# Patient Record
Sex: Female | Born: 1950
Health system: Southern US, Community
[De-identification: ages and names within clinical notes are randomized; demographics above are authoritative.]

## PROBLEM LIST (undated history)

## (undated) DIAGNOSIS — G4700 Insomnia, unspecified: Secondary | ICD-10-CM

## (undated) DIAGNOSIS — M797 Fibromyalgia: Secondary | ICD-10-CM

## (undated) DIAGNOSIS — J45909 Unspecified asthma, uncomplicated: Secondary | ICD-10-CM

## (undated) DIAGNOSIS — R7611 Nonspecific reaction to tuberculin skin test without active tuberculosis: Secondary | ICD-10-CM

## (undated) DIAGNOSIS — M1990 Unspecified osteoarthritis, unspecified site: Secondary | ICD-10-CM

## (undated) HISTORY — DX: Unspecified asthma, uncomplicated: J45.909

## (undated) HISTORY — DX: Nonspecific reaction to tuberculin skin test without active tuberculosis: R76.11

## (undated) HISTORY — PX: TONSILLECTOMY: SUR1361

## (undated) HISTORY — PX: OTHER SURGICAL HISTORY: SHX169

## (undated) HISTORY — DX: Unspecified osteoarthritis, unspecified site: M19.90

## (undated) HISTORY — DX: Fibromyalgia: M79.7

## (undated) HISTORY — DX: Insomnia, unspecified: G47.00

---

## 1988-04-24 HISTORY — PX: TOTAL ABDOMINAL HYSTERECTOMY: SHX209

## 1998-02-18 ENCOUNTER — Other Ambulatory Visit: Admission: RE | Admit: 1998-02-18 | Discharge: 1998-02-18 | Payer: Self-pay | Admitting: Obstetrics and Gynecology

## 1999-03-08 ENCOUNTER — Other Ambulatory Visit: Admission: RE | Admit: 1999-03-08 | Discharge: 1999-03-08 | Payer: Self-pay | Admitting: Obstetrics and Gynecology

## 2000-04-03 ENCOUNTER — Other Ambulatory Visit: Admission: RE | Admit: 2000-04-03 | Discharge: 2000-04-03 | Payer: Self-pay | Admitting: Obstetrics and Gynecology

## 2001-12-04 ENCOUNTER — Encounter: Payer: Self-pay | Admitting: Family Medicine

## 2001-12-04 ENCOUNTER — Encounter: Admission: RE | Admit: 2001-12-04 | Discharge: 2001-12-04 | Payer: Self-pay | Admitting: Family Medicine

## 2003-01-21 ENCOUNTER — Other Ambulatory Visit: Admission: RE | Admit: 2003-01-21 | Discharge: 2003-01-21 | Payer: Self-pay | Admitting: Obstetrics and Gynecology

## 2004-03-15 ENCOUNTER — Other Ambulatory Visit: Admission: RE | Admit: 2004-03-15 | Discharge: 2004-03-15 | Payer: Self-pay | Admitting: Obstetrics and Gynecology

## 2005-05-29 ENCOUNTER — Encounter: Admission: RE | Admit: 2005-05-29 | Discharge: 2005-05-29 | Payer: Self-pay | Admitting: Family Medicine

## 2005-06-06 ENCOUNTER — Other Ambulatory Visit: Admission: RE | Admit: 2005-06-06 | Discharge: 2005-06-06 | Payer: Self-pay | Admitting: Obstetrics and Gynecology

## 2006-09-28 ENCOUNTER — Ambulatory Visit: Payer: Self-pay | Admitting: Internal Medicine

## 2006-12-14 ENCOUNTER — Ambulatory Visit: Payer: Self-pay | Admitting: Internal Medicine

## 2006-12-14 ENCOUNTER — Encounter: Payer: Self-pay | Admitting: Internal Medicine

## 2010-02-15 ENCOUNTER — Emergency Department (HOSPITAL_COMMUNITY): Admission: EM | Admit: 2010-02-15 | Discharge: 2010-02-15 | Payer: Self-pay | Admitting: Emergency Medicine

## 2010-05-15 ENCOUNTER — Encounter: Payer: Self-pay | Admitting: Rheumatology

## 2010-07-06 LAB — URINALYSIS, ROUTINE W REFLEX MICROSCOPIC
Bilirubin Urine: NEGATIVE
Glucose, UA: NEGATIVE mg/dL
Hgb urine dipstick: NEGATIVE
Ketones, ur: NEGATIVE mg/dL
Nitrite: NEGATIVE
Protein, ur: NEGATIVE mg/dL
Specific Gravity, Urine: 1.006 (ref 1.005–1.030)
Urobilinogen, UA: 0.2 mg/dL (ref 0.0–1.0)
pH: 7 (ref 5.0–8.0)

## 2010-07-06 LAB — BASIC METABOLIC PANEL
BUN: 10 mg/dL (ref 6–23)
CO2: 27 mEq/L (ref 19–32)
Chloride: 108 mEq/L (ref 96–112)
Glucose, Bld: 87 mg/dL (ref 70–99)
Potassium: 4.1 mEq/L (ref 3.5–5.1)

## 2013-09-01 ENCOUNTER — Ambulatory Visit
Admission: RE | Admit: 2013-09-01 | Discharge: 2013-09-01 | Disposition: A | Discharge status: Home / Self Care | Payer: BC Managed Care – PPO | Source: Ambulatory Visit | Attending: Family Medicine | Admitting: Family Medicine

## 2013-09-01 ENCOUNTER — Other Ambulatory Visit: Payer: Self-pay | Admitting: Family Medicine

## 2013-09-01 DIAGNOSIS — R509 Fever, unspecified: Secondary | ICD-10-CM

## 2013-09-01 DIAGNOSIS — R05 Cough: Secondary | ICD-10-CM

## 2013-09-01 DIAGNOSIS — R059 Cough, unspecified: Secondary | ICD-10-CM

## 2013-09-22 ENCOUNTER — Encounter: Payer: Self-pay | Admitting: Internal Medicine

## 2014-07-08 ENCOUNTER — Other Ambulatory Visit: Payer: Self-pay | Admitting: Family Medicine

## 2014-07-08 ENCOUNTER — Ambulatory Visit
Admission: RE | Admit: 2014-07-08 | Discharge: 2014-07-08 | Disposition: A | Discharge status: Home / Self Care | Payer: 59 | Source: Ambulatory Visit | Attending: Family Medicine | Admitting: Family Medicine

## 2014-07-08 DIAGNOSIS — R05 Cough: Secondary | ICD-10-CM

## 2014-07-08 DIAGNOSIS — R062 Wheezing: Secondary | ICD-10-CM

## 2014-07-08 DIAGNOSIS — R059 Cough, unspecified: Secondary | ICD-10-CM

## 2014-07-08 DIAGNOSIS — Z2089 Contact with and (suspected) exposure to other communicable diseases: Secondary | ICD-10-CM

## 2014-11-24 ENCOUNTER — Other Ambulatory Visit: Payer: Self-pay | Admitting: Obstetrics and Gynecology

## 2014-11-25 LAB — CYTOLOGY - PAP

## 2015-06-14 ENCOUNTER — Encounter: Payer: Self-pay | Admitting: Internal Medicine

## 2015-06-16 ENCOUNTER — Encounter: Payer: Self-pay | Admitting: Internal Medicine

## 2015-06-16 ENCOUNTER — Ambulatory Visit (INDEPENDENT_AMBULATORY_CARE_PROVIDER_SITE_OTHER): Payer: 59 | Admitting: Internal Medicine

## 2015-06-16 VITALS — BP 132/68 | HR 88 | Ht 59.0 in | Wt 100.8 lb

## 2015-06-16 DIAGNOSIS — R0989 Other specified symptoms and signs involving the circulatory and respiratory systems: Secondary | ICD-10-CM

## 2015-06-16 DIAGNOSIS — R062 Wheezing: Secondary | ICD-10-CM

## 2015-06-16 LAB — NITRIC OXIDE: NITRIC OXIDE: 15

## 2015-06-16 NOTE — Progress Notes (Signed)
Subjective:    Patient ID: Victoria Watson, female    DOB: 1950/12/20, 65 y.o.   MRN: [id number] PCP Astrid Divine, MD  HPI  IOV 06/16/2015  Chief Complaint  Patient presents with  . Pulmonary Consult    Pt referred by Dr. Corliss Skains for wheezing in all lung fields x 2 years. Pt denies cough, CP/tightness, and SOB.     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 Follows with Dr. Algis Downs for fibromyalgia and myofascial spasm since several years. For the last 2 years she reports that both Dr. Algis Downs and her primary care physician Dr. Valentina Lucks have noticed squeaks and wheezing on her physical exam he did cause of which remains unclear. There for she's been referred. Patient herself is asymptomatic except when she exerts really hard when she runs of her grandkids. She says that this abnormal physical exam started in 2015. In May 2015 she had a horrible episode of bronchitis which lasted 3 months she was treated with Z-Pak. Then in August 2015 during a wellness visit was the first and they heard squeaks in her exam. This was followed again in October 2015 with another episode of bronchitis which was treated with Septra. Apparently a chest x-ray at that time did not show pneumonia. Subsequent exam in December 2015 also heard squeaks. Then in March 2016 a diagnosis of asthmatic bronchitis was made for the same squeaks. A repeat chest x-ray apparently was normal. She was given Ventolin but this does not help. Subsequent physical exams always reveals squeaks both in June 2016, August 2016 and December 2006 seen. There for she's been referred here.  In 2009 she was TB skin test positive and took INH prophylaxis for latent TB.  Chest x-ray March 2016 personally visualized and is clear other than hyperaeration.  Exhaled nitric oxide for  as eosinophilic asthma is normal at 15 ppb.    has a past medical history of Fibromyalgia; Insomnia; OA (osteoarthritis); and Positive PPD.   reports that  she has never smoked. She has never used smokeless tobacco.  Past Surgical History  Procedure Laterality Date  . Total abdominal hysterectomy  1990  . Tonsillectomy      Not on File  Immunization History  Administered Date(s) Administered  . Influenza,inj,Quad PF,36+ Mos 12/22/2014  . Tdap 09/03/2009    Family History  Problem Relation Age of Onset  . Heart disease Father   . Heart disease Paternal Grandmother   . Heart disease Paternal Grandfather   . COPD Father      Current outpatient prescriptions:  .  Biotin 5 MG TABS, Take by mouth daily., Disp: , Rfl:  .  Calcium Carbonate-Vitamin D (CALCIUM-VITAMIN D) 500-200 MG-UNIT tablet, Take 2 tablets by mouth daily., Disp: , Rfl:  .  cyclobenzaprine (FLEXERIL) 10 MG tablet, Take 10 mg by mouth 3 (three) times daily as needed for muscle spasms., Disp: , Rfl:  .  cycloSPORINE (RESTASIS) 0.05 % ophthalmic emulsion, Place 1 drop into both eyes 2 (two) times daily., Disp: , Rfl:  .  estradiol (ESTRACE) 1 MG tablet, Take 1 mg by mouth daily., Disp: , Rfl:  .  Eszopiclone (ESZOPICLONE) 3 MG TABS, Take 3 mg by mouth at bedtime. Take immediately before bedtime, Disp: , Rfl:  .  fluticasone (FLONASE) 50 MCG/ACT nasal spray, Place into both nostrils as needed for allergies or rhinitis., Disp: , Rfl:  .  Magnesium 250 MG TABS, Take by mouth daily., Disp: , Rfl:  .  meloxicam (MOBIC)  7.5 MG tablet, Take 3.75 mg by mouth daily., Disp: , Rfl:  .  Multiple Vitamins-Minerals (MULTIVITAMIN WITH MINERALS) tablet, Take 1 tablet by mouth daily., Disp: , Rfl:  .  traMADol (ULTRAM) 50 MG tablet, Take 50 mg by mouth every 6 (six) hours as needed., Disp: , Rfl:       Review of Systems  Constitutional: Negative for fever and unexpected weight change.  HENT: Negative for congestion, dental problem, ear pain, nosebleeds, postnasal drip, rhinorrhea, sinus pressure, sneezing, sore throat and trouble swallowing.   Eyes: Negative for redness and itching.    Respiratory: Negative for cough, chest tightness, shortness of breath and wheezing.   Cardiovascular: Negative for palpitations and leg swelling.  Gastrointestinal: Negative for nausea and vomiting.  Genitourinary: Negative for dysuria.  Musculoskeletal: Negative for joint swelling.  Skin: Negative for rash.  Neurological: Negative for headaches.  Hematological: Does not bruise/bleed easily.  Psychiatric/Behavioral: Negative for dysphoric mood. The patient is not nervous/anxious.        Objective:   Physical Exam  Constitutional: She is oriented to person, place, and time. She appears well-developed and well-nourished. No distress.  HENT:  Head: Normocephalic and atraumatic.  Right Ear: External ear normal.  Left Ear: External ear normal.  Mouth/Throat: Oropharynx is clear and moist. No oropharyngeal exudate.  Eyes: Conjunctivae and EOM are normal. Pupils are equal, round, and reactive to light. Right eye exhibits no discharge. Left eye exhibits no discharge. No scleral icterus.  Neck: Normal range of motion. Neck supple. No JVD present. No tracheal deviation present. No thyromegaly present.  Cardiovascular: Normal rate, regular rhythm, normal heart sounds and intact distal pulses.  Exam reveals no gallop and no friction rub.   No murmur heard. Pulmonary/Chest: Effort normal and breath sounds normal. No respiratory distress. She has no wheezes. She has no rales. She exhibits no tenderness.  End inspiratory squeaky wheeze in the bilateral upper lobes. In the lower lobes are possible crackles  Abdominal: Soft. Bowel sounds are normal. She exhibits no distension and no mass. There is no tenderness. There is no rebound and no guarding.  Musculoskeletal: Normal range of motion. She exhibits no edema or tenderness.  Lymphadenopathy:    She has no cervical adenopathy.  Neurological: She is alert and oriented to person, place, and time. She has normal reflexes. No cranial nerve deficit. She  exhibits normal muscle tone. Coordination normal.  Skin: Skin is warm and dry. No rash noted. She is not diaphoretic. No erythema. No pallor.  Psychiatric: She has a normal mood and affect. Her behavior is normal. Judgment and thought content normal.  Vitals reviewed.   Filed Vitals:   06/16/15 1534  BP: 132/68  Pulse: 88  Height: 4\' 11"  (1.499 m)  Weight: 100 lb 12.8 oz (45.723 kg)  SpO2: 98%    Estimated body mass index is 20.35 kg/(m^2) as calculated from the following:   Height as of this encounter: 4\' 11"  (1.499 m).   Weight as of this encounter: 100 lb 12.8 oz (45.723 kg).      Assessment & Plan:     ICD-9-CM ICD-10-CM   1. Wheezing 786.07 R06.2   2. Chest crackles 786.7 R09.89    Unclear what is going on. It is possible that she has some residual bronchiectasis after her severe episode of bronchitis in May 2015. She does not have eosinophilic airway inflammation as evidenced by normal exhaled nitric oxide. At this point it is best to do several resting noninvasive pulmonary  tests   High Resolution CT chest without contrast on ILD protocol Do full PFT Do Methacholine challenge test  followup  - return to see me or my NP < 3 weeks from now but after completing all of above   Dr. Kalman Shan, M.D., [facility].C.P Pulmonary and Critical Care Medicine Staff Physician [facility] [place] [facility] Pager: [phone], If no answer or between  15:00h - 7:00h: call [phone]  06/16/2015 4:06 PM

## 2015-06-16 NOTE — Addendum Note (Signed)
Addended by: Sheran Luz on: 06/16/2015 05:41 PM   Modules accepted: Orders

## 2015-06-16 NOTE — Patient Instructions (Signed)
ICD-9-CM ICD-10-CM   1. Wheezing 786.07 R06.2   2. Chest crackles 786.7 R09.89     High Resolution CT chest without contrast on ILD protocol Do full PFT Do Methacholine challenge test  followup  - return to see me or my NP < 3 weeks from now but after completing all of above

## 2015-06-28 ENCOUNTER — Ambulatory Visit (HOSPITAL_COMMUNITY)
Admission: RE | Admit: 2015-06-28 | Discharge: 2015-06-28 | Disposition: A | Discharge status: Home / Self Care | Payer: 59 | Source: Ambulatory Visit | Attending: Internal Medicine | Admitting: Internal Medicine

## 2015-06-28 DIAGNOSIS — R062 Wheezing: Secondary | ICD-10-CM | POA: Diagnosis present

## 2015-06-28 DIAGNOSIS — R0989 Other specified symptoms and signs involving the circulatory and respiratory systems: Secondary | ICD-10-CM | POA: Diagnosis not present

## 2015-06-28 LAB — PULMONARY FUNCTION TEST
FEF 25-75 POST: 1.87 L/s
FEF 25-75 Pre: 1.94 L/sec
FEF2575-%CHANGE-POST: -3 %
FEF2575-%PRED-POST: 96 %
FEF2575-%PRED-PRE: 100 %
FEV1-%CHANGE-POST: 0 %
FEV1-%Pred-Post: 91 %
FEV1-%Pred-Pre: 92 %
FEV1-Post: 1.83 L
FEV1-Pre: 1.83 L
FEV1FVC-%CHANGE-POST: -1 %
FEV1FVC-%PRED-PRE: 104 %
FEV6-%Change-Post: 1 %
FEV6-%Pred-Post: 90 %
FEV6-%Pred-Pre: 89 %
FEV6-POST: 2.26 L
FEV6-Pre: 2.23 L
FEV6FVC-%Change-Post: 0 %
FEV6FVC-%PRED-POST: 104 %
FEV6FVC-%Pred-Pre: 103 %
FVC-%Change-Post: 1 %
FVC-%PRED-POST: 87 %
FVC-%PRED-PRE: 86 %
FVC-POST: 2.27 L
FVC-PRE: 2.24 L
POST FEV1/FVC RATIO: 80 %
PRE FEV1/FVC RATIO: 82 %
PRE FEV6/FVC RATIO: 100 %
Post FEV6/FVC ratio: 100 %

## 2015-06-28 MED ORDER — METHACHOLINE 16 MG/ML NEB SOLN
2.0000 mL | Freq: Once | RESPIRATORY_TRACT | Status: AC
  Administered 2015-06-28: 32 mg via RESPIRATORY_TRACT

## 2015-06-28 MED ORDER — SODIUM CHLORIDE 0.9 % IN NEBU
3.0000 mL | INHALATION_SOLUTION | Freq: Once | RESPIRATORY_TRACT | Status: AC
  Administered 2015-06-28: 3 mL via RESPIRATORY_TRACT

## 2015-06-28 MED ORDER — METHACHOLINE 1 MG/ML NEB SOLN
2.0000 mL | Freq: Once | RESPIRATORY_TRACT | Status: AC
  Administered 2015-06-28: 2 mg via RESPIRATORY_TRACT

## 2015-06-28 MED ORDER — METHACHOLINE 0.0625 MG/ML NEB SOLN
2.0000 mL | Freq: Once | RESPIRATORY_TRACT | Status: AC
  Administered 2015-06-28: 0.125 mg via RESPIRATORY_TRACT

## 2015-06-28 MED ORDER — ALBUTEROL SULFATE (2.5 MG/3ML) 0.083% IN NEBU
2.5000 mg | INHALATION_SOLUTION | Freq: Once | RESPIRATORY_TRACT | Status: AC
  Administered 2015-06-28: 2.5 mg via RESPIRATORY_TRACT

## 2015-06-28 MED ORDER — METHACHOLINE 0.25 MG/ML NEB SOLN
2.0000 mL | Freq: Once | RESPIRATORY_TRACT | Status: AC
  Administered 2015-06-28: 0.5 mg via RESPIRATORY_TRACT

## 2015-06-28 MED ORDER — METHACHOLINE 4 MG/ML NEB SOLN
2.0000 mL | Freq: Once | RESPIRATORY_TRACT | Status: AC
  Administered 2015-06-28: 8 mg via RESPIRATORY_TRACT

## 2015-06-29 ENCOUNTER — Ambulatory Visit (INDEPENDENT_AMBULATORY_CARE_PROVIDER_SITE_OTHER)
Admission: RE | Admit: 2015-06-29 | Discharge: 2015-06-29 | Disposition: A | Discharge status: Home / Self Care | Payer: 59 | Source: Ambulatory Visit | Attending: Internal Medicine | Admitting: Internal Medicine

## 2015-06-29 ENCOUNTER — Ambulatory Visit (HOSPITAL_COMMUNITY)
Admission: RE | Admit: 2015-06-29 | Discharge: 2015-06-29 | Disposition: A | Discharge status: Home / Self Care | Payer: 59 | Source: Ambulatory Visit | Attending: Internal Medicine | Admitting: Internal Medicine

## 2015-06-29 DIAGNOSIS — R062 Wheezing: Secondary | ICD-10-CM | POA: Insufficient documentation

## 2015-06-29 DIAGNOSIS — R0989 Other specified symptoms and signs involving the circulatory and respiratory systems: Secondary | ICD-10-CM | POA: Insufficient documentation

## 2015-06-29 LAB — PULMONARY FUNCTION TEST
DL/VA % PRED: 132 %
DL/VA: 5.28 ml/min/mmHg/L
DLCO UNC: 18.28 ml/min/mmHg
DLCO unc % pred: 108 %
FEF 25-75 POST: 1.56 L/s
FEF 25-75 Pre: 1.63 L/sec
FEF2575-%CHANGE-POST: -3 %
FEF2575-%PRED-POST: 80 %
FEF2575-%Pred-Pre: 83 %
FEV1-%CHANGE-POST: 0 %
FEV1-%PRED-PRE: 91 %
FEV1-%Pred-Post: 90 %
FEV1-Post: 1.8 L
FEV1-Pre: 1.82 L
FEV1FVC-%CHANGE-POST: 6 %
FEV1FVC-%PRED-PRE: 100 %
FEV6-%Change-Post: -7 %
FEV6-%Pred-Post: 87 %
FEV6-%Pred-Pre: 93 %
FEV6-POST: 2.17 L
FEV6-Pre: 2.33 L
FEV6FVC-%PRED-POST: 104 %
FEV6FVC-%Pred-Pre: 104 %
FVC-%CHANGE-POST: -7 %
FVC-%PRED-POST: 83 %
FVC-%PRED-PRE: 90 %
FVC-POST: 2.17 L
FVC-PRE: 2.33 L
POST FEV1/FVC RATIO: 83 %
PRE FEV1/FVC RATIO: 78 %
Post FEV6/FVC ratio: 100 %
Pre FEV6/FVC Ratio: 100 %
RV % pred: 98 %
RV: 1.74 L
TLC % pred: 96 %
TLC: 4.07 L

## 2015-06-29 MED ORDER — ALBUTEROL SULFATE (2.5 MG/3ML) 0.083% IN NEBU
2.5000 mg | INHALATION_SOLUTION | Freq: Once | RESPIRATORY_TRACT | Status: AC
  Administered 2015-06-29: 2.5 mg via RESPIRATORY_TRACT

## 2015-07-01 ENCOUNTER — Telehealth: Payer: Self-pay | Admitting: Internal Medicine

## 2015-07-01 NOTE — Telephone Encounter (Signed)
  pft and methacholine - normal  CT  - no cancer  -no fibrosis  - has air trapping - some mild bronchiectaasis   Not sure how to explain her symptoms  Plan  - see TP  In few days and discuss empiric mdi Rx v CPST  Dr. Kalman ShanMurali Alvita Fana, M.D., [facility].C.P Pulmonary and Critical Care Medicine Staff Physician [facility] [place] [facility] Pager: [phone], If no answer or between  15:00h - 7:00h: call [phone]  07/01/2015 3:38 PM

## 2015-07-02 ENCOUNTER — Encounter: Payer: Self-pay | Admitting: *Deleted

## 2015-07-05 ENCOUNTER — Ambulatory Visit (INDEPENDENT_AMBULATORY_CARE_PROVIDER_SITE_OTHER): Payer: 59 | Admitting: Adult Health

## 2015-07-05 ENCOUNTER — Encounter: Payer: Self-pay | Admitting: Adult Health

## 2015-07-05 VITALS — BP 126/82 | HR 79 | Temp 97.7°F | Ht <= 58 in | Wt 103.0 lb

## 2015-07-05 DIAGNOSIS — J479 Bronchiectasis, uncomplicated: Secondary | ICD-10-CM | POA: Diagnosis not present

## 2015-07-05 MED ORDER — ALBUTEROL SULFATE HFA 108 (90 BASE) MCG/ACT IN AERS: 2.0000 | INHALATION_SPRAY | Freq: Four times a day (QID) | RESPIRATORY_TRACT | Status: DC | PRN

## 2015-07-05 MED ORDER — AEROCHAMBER MV MISC: Status: DC

## 2015-07-05 NOTE — Telephone Encounter (Signed)
Called and spoke to pt. Informed her of the results and recs per MR. Pt verbalized understanding and denied any further questions or concerns at this time.   

## 2015-07-05 NOTE — Patient Instructions (Signed)
May use ProAir 2 puffs every 4hr as needed for wheezing /shortness of breath.  Call back if you change your mind regarding CPST (cardiopulmonary stress test )  follow up Dr. [name] in 4 months and As needed   Please contact office for sooner follow up if symptoms do not improve or worsen or seek emergency care

## 2015-07-07 ENCOUNTER — Ambulatory Visit: Payer: 59 | Admitting: *Deleted

## 2015-07-09 DIAGNOSIS — J479 Bronchiectasis, uncomplicated: Secondary | ICD-10-CM | POA: Insufficient documentation

## 2015-07-09 NOTE — Assessment & Plan Note (Signed)
Mild bronchiectasis noted in RML with mild scarring -this may be what is causing her crackles /wheezing on exam. PFT/Methacholine challenge is normal .  FENO is low. O2 sats are good on RA.  Discussed CPST to further evaluate she would like to hold off on this for now.  Will add proair As needed    Plan  May use ProAir 2 puffs every 4hr as needed for wheezing /shortness of breath.  Call back if you change your mind regarding CPST (cardiopulmonary stress test )  follow up Dr. [name] in 4 months and As needed   Please contact office for sooner follow up if symptoms do not improve or worsen or seek emergency care

## 2015-07-09 NOTE — Progress Notes (Signed)
   Subjective:    Patient ID: Victoria Watson, female    DOB: 1950/11/21, 65 y.o.   MRN: [id number]  HPI 65 yo female never smoker seen for pulmonary consult for wheezing  05/2015 with Dr. [name]  Retired Charity fundraiserN.   TEST  FENO 15 06/16/15    07/05/15 Follow up :  Pt returns for follow up . Seen 3 weeks ago for pulmonary consult for persistent wheezing on physical exam . Had severe bronchitis in 2015 with residual wheezing and squeaks on resp exam . Referred to Dr. [name] last month for exam.  She underwent a HRCT chest that was neg for ILD. Mild Bronchiectasis/RML scarring. And extensive air trapping throughout lungs indicating small airways dz., 2 incidental LLL nodules 4 mm . Marland Kitchen.  She is a never smoker  Methacholine challenge test was normal. We discussed her results in detail .  All questions were answered . Discussed a CPST to better evaluate her dsypnea. Also discussed adding proair inhaler. To use As needed  .  She denies chest pain, palpitations, orthopnea, edema or fever.        Review of Systems. Constitutional:   No  weight loss, night sweats,  Fevers, chills, fatigue, or  lassitude.  HEENT:   No headaches,  Difficulty swallowing,  Tooth/dental problems, or  Sore throat,                No sneezing, itching, ear ache, nasal congestion, post nasal drip,   CV:  No chest pain,  Orthopnea, PND, swelling in lower extremities, anasarca, dizziness, palpitations, syncope.   GI  No heartburn, indigestion, abdominal pain, nausea, vomiting, diarrhea, change in bowel habits, loss of appetite, bloody stools.   Resp:   No chest wall deformity  Skin: no rash or lesions.  GU: no dysuria, change in color of urine, no urgency or frequency.  No flank pain, no hematuria   MS:  No joint pain or swelling.  No decreased range of motion.  No back pain.  Psych:  No change in mood or affect. No depression or anxiety.  No memory loss.         Objective:   Physical Exam Filed Vitals:   07/05/15 1418  BP: 126/82  Pulse: 79  Temp: 97.7 F (36.5 C)  TempSrc: Oral  Height: 4\' 10"  (1.473 m)  Weight: 103 lb (46.72 kg)  SpO2: 97%   GEN: A/Ox3; pleasant , NAD,    HEENT:  [place]/AT,  EACs-clear, TMs-wnl, NOSE-clear, THROAT-clear, no lesions, no postnasal drip or exudate noted.   NECK:  Supple w/ fair ROM; no JVD; normal carotid impulses w/o bruits; no thyromegaly or nodules palpated; no lymphadenopathy.  RESP  Faint BB crackles , squeaks on inspiration. no accessory muscle use, no dullness to percussion  CARD:  RRR, no m/r/g  , no peripheral edema, pulses intact, no cyanosis or clubbing.  GI:   Soft & nt; nml bowel sounds; no organomegaly or masses detected.  Musco: Warm bil, no deformities or joint swelling noted.   Neuro: alert, no focal deficits noted.    Skin: Warm, no lesions or rashes  Royalty Domagala NP-C  [place] [facility]  07/05/15        Assessment & Plan:

## 2016-02-14 ENCOUNTER — Other Ambulatory Visit: Payer: Self-pay | Admitting: Rheumatology

## 2016-02-15 ENCOUNTER — Other Ambulatory Visit: Payer: Self-pay | Admitting: Radiology

## 2016-02-15 NOTE — Telephone Encounter (Signed)
Faxed lunesta to pharmacy

## 2016-02-15 NOTE — Telephone Encounter (Signed)
ok 

## 2016-02-15 NOTE — Telephone Encounter (Signed)
Last visit 09/28/15 Next visit 03/29/16 Ok to refill Lunesta ?

## 2016-02-16 ENCOUNTER — Telehealth: Payer: Self-pay | Admitting: Rheumatology

## 2016-02-16 NOTE — Telephone Encounter (Signed)
I have resent the fax / if they call again verify fax number I sent it to [phone]

## 2016-02-16 NOTE — Telephone Encounter (Signed)
[facility] [address] [place] them because they have to actually see the rx.

## 2016-03-10 ENCOUNTER — Other Ambulatory Visit: Payer: Self-pay | Admitting: *Deleted

## 2016-03-10 ENCOUNTER — Other Ambulatory Visit: Payer: Self-pay | Admitting: Rheumatology

## 2016-03-10 NOTE — Telephone Encounter (Signed)
Last Visit: 09/28/15 Next Visit: 03/29/16 UDS: 09/21/15 Narc Agreement: 09/06/15   Okay to refill Tramadol?

## 2016-03-27 ENCOUNTER — Other Ambulatory Visit: Payer: Self-pay | Admitting: Radiology

## 2016-03-27 DIAGNOSIS — Z79899 Other long term (current) drug therapy: Secondary | ICD-10-CM

## 2016-03-27 LAB — COMPLETE METABOLIC PANEL WITH GFR
ALBUMIN: 4 g/dL (ref 3.6–5.1)
ALK PHOS: 58 U/L (ref 33–130)
ALT: 20 U/L (ref 6–29)
AST: 25 U/L (ref 10–35)
BILIRUBIN TOTAL: 0.3 mg/dL (ref 0.2–1.2)
BUN: 15 mg/dL (ref 7–25)
CO2: 29 mmol/L (ref 20–31)
Calcium: 9.5 mg/dL (ref 8.6–10.4)
Chloride: 103 mmol/L (ref 98–110)
Creat: 0.77 mg/dL (ref 0.50–0.99)
GFR, EST NON AFRICAN AMERICAN: 82 mL/min (ref >=60)
Glucose, Bld: 120 mg/dL — ABNORMAL HIGH (ref 65–99)
Potassium: 4.1 mmol/L (ref 3.5–5.3)
Sodium: 139 mmol/L (ref 135–146)
TOTAL PROTEIN: 6.7 g/dL (ref 6.1–8.1)

## 2016-03-27 LAB — CBC WITH DIFFERENTIAL/PLATELET
BASOS ABS: 0 {cells}/uL (ref 0–200)
Basophils Relative: 0 %
EOS ABS: 81 {cells}/uL (ref 15–500)
Eosinophils Relative: 1 %
HCT: 38.2 % (ref 35.0–45.0)
Hemoglobin: 12.3 g/dL (ref 11.7–15.5)
Lymphocytes Relative: 31 %
Lymphs Abs: 2511 cells/uL (ref 850–3900)
MCH: 28.9 pg (ref 27.0–33.0)
MCHC: 32.2 g/dL (ref 32.0–36.0)
MCV: 89.7 fL (ref 80.0–100.0)
MONOS PCT: 6 %
MPV: 8.9 fL (ref 7.5–12.5)
Monocytes Absolute: 486 cells/uL (ref 200–950)
NEUTROS ABS: 5022 {cells}/uL (ref 1500–7800)
NEUTROS PCT: 62 %
PLATELETS: 295 10*3/uL (ref 140–400)
RBC: 4.26 MIL/uL (ref 3.80–5.10)
RDW: 13.5 % (ref 11.0–15.0)
WBC: 8.1 10*3/uL (ref 3.8–10.8)

## 2016-03-28 DIAGNOSIS — M19042 Primary osteoarthritis, left hand: Principal | ICD-10-CM

## 2016-03-28 DIAGNOSIS — M19041 Primary osteoarthritis, right hand: Secondary | ICD-10-CM | POA: Insufficient documentation

## 2016-03-28 DIAGNOSIS — M5136 Other intervertebral disc degeneration, lumbar region: Secondary | ICD-10-CM | POA: Insufficient documentation

## 2016-03-28 DIAGNOSIS — R5383 Other fatigue: Secondary | ICD-10-CM | POA: Insufficient documentation

## 2016-03-28 DIAGNOSIS — G4700 Insomnia, unspecified: Secondary | ICD-10-CM | POA: Insufficient documentation

## 2016-03-28 DIAGNOSIS — M7918 Myalgia, other site: Secondary | ICD-10-CM | POA: Insufficient documentation

## 2016-03-28 DIAGNOSIS — M5030 Other cervical disc degeneration, unspecified cervical region: Secondary | ICD-10-CM | POA: Insufficient documentation

## 2016-03-28 NOTE — Progress Notes (Signed)
Office Visit Note  Patient: Victoria Watson             Date of Birth: 1951-01-01           MRN: [id number]             PCP: Osborne Casco, MD Referring: Kelton Pillar, MD Visit Date: 03/29/2016 Occupation: _0 @    Subjective:  Pain of the Left Shoulder Last seen 09/28/2015 for fibromyalgia  History of Present Illness: Victoria Watson is a 65 y.o. female  History of fibromyalgia Fatigue and insomnia Last visit bilateral trapezius muscles spasms were going on and patient was using Voltaren gel. One reviews cortisone injection to the trapezius muscles is left trapezius. We decided to do lidocaine only injections in the future. Patient is requesting the lidocaine only injection today to the trapezius muscle especially a lot of left     Activities of Daily Living:  Patient reports morning stiffness for 30 minutes.   Patient Reports nocturnal pain.  Difficulty dressing/grooming: Reports Difficulty climbing stairs: reports Difficulty getting out of chair: Reports Difficulty using hands for taps, buttons, cutlery, and/or writing: Reports   Review of Systems  Constitutional: Positive for fatigue.  HENT: Negative for mouth sores and mouth dryness.   Eyes: Negative for dryness.  Respiratory: Negative for shortness of breath.   Gastrointestinal: Negative for constipation and diarrhea.  Musculoskeletal: Positive for myalgias, muscle tenderness (BILATERAL TRAPEZIUS MUSCLE SPASM) and myalgias.  Skin: Negative for sensitivity to sunlight.  Psychiatric/Behavioral: Positive for sleep disturbance. Negative for decreased concentration.    PMFS History:  Patient Active Problem List   Diagnosis Date Noted  . Myofacial muscle pain 03/28/2016  . Insomnia 03/28/2016  . Other fatigue 03/28/2016  . DDD (degenerative disc disease), lumbar 03/28/2016  . DDD (degenerative disc disease), cervical 03/28/2016  . Primary osteoarthritis of both hands 03/28/2016  .  Bronchiectasis ([place]) 07/09/2015  . Wheezing 06/16/2015  . Chest crackles 06/16/2015    Past Medical History:  Diagnosis Date  . Fibromyalgia   . Insomnia   . OA (osteoarthritis)   . Positive PPD    Took INH tabs x 3 months    Family History  Problem Relation Age of Onset  . Heart disease Father   . Heart disease Paternal Grandmother   . Heart disease Paternal Grandfather   . COPD Father    Past Surgical History:  Procedure Laterality Date  . TONSILLECTOMY    . TOTAL ABDOMINAL HYSTERECTOMY  1990   Social History   Social History Narrative  . No narrative on file     Objective: Vital Signs: BP 120/76   Pulse 82   Resp 14   Ht 4' 10.5" (1.486 m)   Wt 101 lb (45.8 kg)   BMI 20.75 kg/m    Physical Exam  Constitutional: She is oriented to person, place, and time. She appears well-developed and well-nourished.  HENT:  Head: Normocephalic and atraumatic.  Eyes: EOM are normal. Pupils are equal, round, and reactive to light.  Cardiovascular: Normal rate, regular rhythm and normal heart sounds.  Exam reveals no gallop and no friction rub.   No murmur heard. Pulmonary/Chest: Effort normal and breath sounds normal. She has no wheezes. She has no rales.  Abdominal: Soft. Bowel sounds are normal. She exhibits no distension. There is no tenderness. There is no guarding. No hernia.  Musculoskeletal: Normal range of motion. She exhibits no edema, tenderness or deformity.  Lymphadenopathy:    She has no cervical  adenopathy.  Neurological: She is alert and oriented to person, place, and time. Coordination normal.  Skin: Skin is warm and dry. Capillary refill takes less than 2 seconds. No rash noted.  Psychiatric: She has a normal mood and affect. Her behavior is normal.     Musculoskeletal Exam:  Full range of motion of all joints Grip strength is equal and strong bilaterally Fiber myalgia tender points are 6 out of 18 positive: Bilateral trapezius, bilateral SI, bilateral  greater trochanter bursa  CDAI Exam: No CDAI exam completed.    Investigation: No additional findings. Orders Only on 03/27/2016  Component Date Value Ref Range Status  . WBC 03/27/2016 8.1  3.8 - 10.8 K/uL Final  . RBC 03/27/2016 4.26  3.80 - 5.10 MIL/uL Final  . Hemoglobin 03/27/2016 12.3  11.7 - 15.5 g/dL Final  . HCT 03/27/2016 38.2  35.0 - 45.0 % Final  . MCV 03/27/2016 89.7  80.0 - 100.0 fL Final  . MCH 03/27/2016 28.9  27.0 - 33.0 pg Final  . MCHC 03/27/2016 32.2  32.0 - 36.0 g/dL Final  . RDW 03/27/2016 13.5  11.0 - 15.0 % Final  . Platelets 03/27/2016 295  140 - 400 K/uL Final  . MPV 03/27/2016 8.9  7.5 - 12.5 fL Final  . Neutro Abs 03/27/2016 5022  1,500 - 7,800 cells/uL Final  . Lymphs Abs 03/27/2016 2511  850 - 3,900 cells/uL Final  . Monocytes Absolute 03/27/2016 486  200 - 950 cells/uL Final  . Eosinophils Absolute 03/27/2016 81  15 - 500 cells/uL Final  . Basophils Absolute 03/27/2016 0  0 - 200 cells/uL Final  . Neutrophils Relative % 03/27/2016 62  % Final  . Lymphocytes Relative 03/27/2016 31  % Final  . Monocytes Relative 03/27/2016 6  % Final  . Eosinophils Relative 03/27/2016 1  % Final  . Basophils Relative 03/27/2016 0  % Final  . Smear Review 03/27/2016 Criteria for review not met   Final  . Sodium 03/27/2016 139  135 - 146 mmol/L Final  . Potassium 03/27/2016 4.1  3.5 - 5.3 mmol/L Final  . Chloride 03/27/2016 103  98 - 110 mmol/L Final  . CO2 03/27/2016 29  20 - 31 mmol/L Final  . Glucose, Bld 03/27/2016 120* 65 - 99 mg/dL Final  . BUN 03/27/2016 15  7 - 25 mg/dL Final  . Creat 03/27/2016 0.77  0.50 - 0.99 mg/dL Final   Comment:   For patients > or = 63 years of age: The upper reference limit for Creatinine is approximately 13% higher for people identified as African-American.     . Total Bilirubin 03/27/2016 0.3  0.2 - 1.2 mg/dL Final  . Alkaline Phosphatase 03/27/2016 58  33 - 130 U/L Final  . AST 03/27/2016 25  10 - 35 U/L Final  . ALT  03/27/2016 20  6 - 29 U/L Final  . Total Protein 03/27/2016 6.7  6.1 - 8.1 g/dL Final  . Albumin 03/27/2016 4.0  3.6 - 5.1 g/dL Final  . Calcium 03/27/2016 9.5  8.6 - 10.4 mg/dL Final  . GFR, Est African American 03/27/2016 >89  >=60 mL/min Final  . GFR, Est Non African American 03/27/2016 82  >=60 mL/min Final    Imaging: Xr Knee 3 View Left  Result Date: 03/29/2016 Moderate medial compartment narrowing Moderate patellofemoral joint space narrowing No CPPD (Left knee is better than the right knee)  Xr Knee 3 View Right  Result Date: 03/29/2016 Moderate medial compartment narrowing  Moderate patellofemoral joint space narrowing No CPPD    Speciality Comments: No specialty comments available.    Procedures:  Trigger Point Inj Date/Time: 03/29/2016 12:09 PM Performed by: Eliezer Lofts Authorized by: Eliezer Lofts   Consent Given by:  Patient Site marked: the procedure site was marked   Timeout: prior to procedure the correct patient, procedure, and site was verified   Indications:  Muscle spasm and pain Total # of Trigger Points:  2 Location: neck   Needle Size:  27 G Approach:  Dorsal Medications #1:  0.5 mL lidocaine 1 % Medications #2:  0.5 mL lidocaine 1 % Patient tolerance:  Patient tolerated the procedure well with no immediate complications Comments: Bilateral trapezius muscles were injected with 0.5 ML's of lidocaine without Kenalog. Patient tolerated procedure well.   Allergies: Patient has no known allergies.   Assessment / Plan:     Visit Diagnoses: Primary osteoarthritis of both hands  Myofacial muscle pain  Primary insomnia  Other fatigue  DDD (degenerative disc disease), cervical  DDD (degenerative disc disease), lumbar  Bilateral chronic knee pain - Plan: XR KNEE 3 VIEW RIGHT, XR KNEE 3 VIEW LEFT   Bilateral trapezius muscles were injected with 0.5 ML's of lidocaine without Kenalog. Patient tolerated procedure well. Note that Kenalog has  caused dimpling at injection site in the past.  Patient has enough tramadol at home at this time it does not need to refill Her urine drug screen was negative as of May 2017 I refilled her Lunesta and Voltaren gel.  I gave her 4 small boxes of pennsaid to go:  Edie; LOT:  [id number]; EXP 12-2016 Patient aware not to use Voltaren gel and Pennsaid at the same time. She will use Pennsaid now and when she is finished, she will buy Voltaren gel. She also has enough Lunesta at home for now and she will fill the Lunesta only when she is out of the medication.   Orders: Orders Placed This Encounter  Procedures  . Trigger Point Injection  . XR KNEE 3 VIEW RIGHT  . XR KNEE 3 VIEW LEFT   Meds ordered this encounter  Medications  . diclofenac sodium (VOLTAREN) 1 % GEL    Sig: Apply 4 g topically 4 (four) times daily. Voltaren Gel 3 grams to 3 large joints upto TID 3 TUBES with 3 refills    Dispense:  3 Tube    Refill:  3    Voltaren Gel 3 grams to 3 large joints upto TID 3 TUBES with 3 refills    Order Specific Question:   Supervising Provider    Answer:   Bo Merino [[id number]]  . Eszopiclone 3 MG TABS    Sig: Take 1 tablet (3 mg total) by mouth at bedtime. Take immediately before bedtime    Dispense:  30 tablet    Refill:  2    Pt just refilled lunest early dec 2017. This Rx is to be dispensed when current refills run out.    Order Specific Question:   Supervising Provider    Answer:   Bo Merino [phone]    Face-to-face time spent with patient was 40 minutes. 50% of time was spent in counseling and coordination of care.  Follow-Up Instructions: Return in about 6 months (around 09/27/2016) for Fibromyalga,fatigue,insomnia,sj pain,.   Eliezer Lofts, PA-C

## 2016-03-28 NOTE — Progress Notes (Signed)
Labs normal.

## 2016-03-29 ENCOUNTER — Ambulatory Visit (INDEPENDENT_AMBULATORY_CARE_PROVIDER_SITE_OTHER): Payer: 59

## 2016-03-29 ENCOUNTER — Ambulatory Visit (INDEPENDENT_AMBULATORY_CARE_PROVIDER_SITE_OTHER): Payer: 59 | Admitting: Rheumatology

## 2016-03-29 ENCOUNTER — Encounter: Payer: Self-pay | Admitting: Rheumatology

## 2016-03-29 VITALS — BP 120/76 | HR 82 | Resp 14 | Ht 58.5 in | Wt 101.0 lb

## 2016-03-29 DIAGNOSIS — M62838 Other muscle spasm: Secondary | ICD-10-CM | POA: Diagnosis not present

## 2016-03-29 DIAGNOSIS — M19042 Primary osteoarthritis, left hand: Secondary | ICD-10-CM

## 2016-03-29 DIAGNOSIS — M19041 Primary osteoarthritis, right hand: Secondary | ICD-10-CM | POA: Diagnosis not present

## 2016-03-29 DIAGNOSIS — M5136 Other intervertebral disc degeneration, lumbar region: Secondary | ICD-10-CM

## 2016-03-29 DIAGNOSIS — M25562 Pain in left knee: Secondary | ICD-10-CM | POA: Diagnosis not present

## 2016-03-29 DIAGNOSIS — G8929 Other chronic pain: Secondary | ICD-10-CM

## 2016-03-29 DIAGNOSIS — R5383 Other fatigue: Secondary | ICD-10-CM

## 2016-03-29 DIAGNOSIS — F5101 Primary insomnia: Secondary | ICD-10-CM | POA: Diagnosis not present

## 2016-03-29 DIAGNOSIS — M25561 Pain in right knee: Secondary | ICD-10-CM | POA: Diagnosis not present

## 2016-03-29 DIAGNOSIS — M5030 Other cervical disc degeneration, unspecified cervical region: Secondary | ICD-10-CM

## 2016-03-29 DIAGNOSIS — M791 Myalgia: Secondary | ICD-10-CM | POA: Diagnosis not present

## 2016-03-29 DIAGNOSIS — M7918 Myalgia, other site: Secondary | ICD-10-CM

## 2016-03-29 MED ORDER — ESZOPICLONE 3 MG PO TABS: 3.0000 mg | ORAL_TABLET | Freq: Every day | ORAL | 2 refills | Status: DC

## 2016-03-29 MED ORDER — DICLOFENAC SODIUM 1 % TD GEL: 4.0000 g | Freq: Four times a day (QID) | TRANSDERMAL | 3 refills | Status: AC

## 2016-03-29 MED ORDER — LIDOCAINE HCL 1 % IJ SOLN
0.5000 mL | INTRAMUSCULAR | Status: AC | PRN
  Administered 2016-03-29: .5 mL

## 2016-03-31 ENCOUNTER — Other Ambulatory Visit: Payer: Self-pay | Admitting: *Deleted

## 2016-05-01 ENCOUNTER — Other Ambulatory Visit: Payer: Self-pay | Admitting: Rheumatology

## 2016-05-02 NOTE — Telephone Encounter (Signed)
Last Visit: 03/29/16 Next Visit: 09/27/16 Labs: 03/27/16 WNL  Okay to refill Meloxicam?

## 2016-06-02 ENCOUNTER — Other Ambulatory Visit: Payer: Self-pay | Admitting: Rheumatology

## 2016-06-02 NOTE — Telephone Encounter (Signed)
Last Visit: 03/29/16 Next Visit: 09/27/16 UDS: 09/21/15 Narc Agreement: 09/06/15  Okay to refill Tramadol?

## 2016-07-17 ENCOUNTER — Other Ambulatory Visit: Payer: Self-pay | Admitting: Rheumatology

## 2016-07-17 MED ORDER — ESZOPICLONE 3 MG PO TABS: 3.0000 mg | ORAL_TABLET | Freq: Every day | ORAL | 2 refills | Status: DC

## 2016-07-17 NOTE — Telephone Encounter (Signed)
[facility] [address]. Patient is completely out.

## 2016-07-17 NOTE — Telephone Encounter (Signed)
Last Visit: 03/29/16 Next Visit: 09/27/16  Okay to refill Lunesta?

## 2016-07-21 ENCOUNTER — Other Ambulatory Visit: Payer: Self-pay | Admitting: Rheumatology

## 2016-07-25 NOTE — Telephone Encounter (Signed)
Please take with food.

## 2016-07-25 NOTE — Telephone Encounter (Signed)
Last Visit: 03/29/16 Next Visit: 09/27/16  Okay to refill Mobic?

## 2016-07-28 ENCOUNTER — Other Ambulatory Visit: Payer: Self-pay | Admitting: *Deleted

## 2016-07-28 MED ORDER — FOLIC ACID-VIT B6-VIT B12 2.2-25-1 MG PO TABS: 1.0000 | ORAL_TABLET | Freq: Every day | ORAL | 3 refills | Status: DC

## 2016-07-28 NOTE — Telephone Encounter (Signed)
Last Visit: 03/29/16 Next Visit: 09/27/16  Okay to refill TL Delene Ruffini?

## 2016-08-23 ENCOUNTER — Other Ambulatory Visit: Payer: Self-pay | Admitting: Rheumatology

## 2016-08-24 NOTE — Telephone Encounter (Signed)
Last Visit: 03/29/16 Next Visit: 09/27/16 UDS: 09/21/15 Narc Agreement: 09/06/15  Okay to refill Tramadol?

## 2016-08-30 ENCOUNTER — Telehealth: Payer: Self-pay | Admitting: *Deleted

## 2016-08-30 NOTE — Telephone Encounter (Signed)
-----   Message from Southside[place]aitik Pan[place]ala, [place] sent at 03/29/2016 12:31 PM EST ----- Regarding: Prior AUTH Euflexxa both knees 3  CAN APPLY 04/27/2016 Prior AUTH Euflexxa both knees 3    CAN APPLY 04/27/2016

## 2016-08-30 NOTE — Telephone Encounter (Signed)
Patient does not want Euflexxa at this time. [facility] [phone].

## 2016-09-22 NOTE — Progress Notes (Signed)
Office Visit Note  Patient: Victoria Watson             Date of Birth: January 16, 1951           MRN: [id number]             PCP: Kelton Pillar, MD Referring: Kelton Pillar, MD Visit Date: 09/27/2016 Occupation: '@GUAROCC' @    Subjective:  Medication Management   History of Present Illness: Victoria Watson is a 66 y.o. female  Doing well this week. Hardly any pain to shoulders .  Pain to bilateral 2nd fingers at dip joints   Activities of Daily Living:  Patient reports morning stiffness for 15 minutes.   Patient Denies nocturnal pain.  Difficulty dressing/grooming: Denies Difficulty climbing stairs: Denies Difficulty getting out of chair: Denies Difficulty using hands for taps, buttons, cutlery, and/or writing: Reports   Review of Systems  Constitutional: Positive for fatigue.  HENT: Negative for mouth sores and mouth dryness.   Eyes: Negative for dryness.  Respiratory: Negative for shortness of breath.   Gastrointestinal: Negative for constipation and diarrhea.  Musculoskeletal: Positive for myalgias and myalgias.  Skin: Negative for sensitivity to sunlight.  Psychiatric/Behavioral: Positive for sleep disturbance. Negative for decreased concentration.    PMFS History:  Patient Active Problem List   Diagnosis Date Noted  . Myofacial muscle pain 03/28/2016  . Insomnia 03/28/2016  . Other fatigue 03/28/2016  . DDD (degenerative disc disease), lumbar 03/28/2016  . DDD (degenerative disc disease), cervical 03/28/2016  . Primary osteoarthritis of both hands 03/28/2016  . Bronchiectasis ([place]) 07/09/2015  . Wheezing 06/16/2015  . Chest crackles 06/16/2015    Past Medical History:  Diagnosis Date  . Fibromyalgia   . Insomnia   . OA (osteoarthritis)   . Positive PPD    Took INH tabs x 3 months    Family History  Problem Relation Age of Onset  . Heart disease Father   . COPD Father   . Heart disease Paternal Grandmother   . Heart disease Paternal  Grandfather    Past Surgical History:  Procedure Laterality Date  . TONSILLECTOMY    . tooth implant Left   . TOTAL ABDOMINAL HYSTERECTOMY  1990   Social History   Social History Narrative  . No narrative on file     Objective: Vital Signs: BP 124/62   Pulse 74   Resp 16   Ht 4' 10.5" (1.486 m)   Wt 94 lb (42.6 kg)   BMI 19.31 kg/m    Physical Exam  Constitutional: She is oriented to person, place, and time. She appears well-developed and well-nourished.  HENT:  Head: Normocephalic and atraumatic.  Eyes: EOM are normal. Pupils are equal, round, and reactive to light.  Cardiovascular: Normal rate, regular rhythm and normal heart sounds.  Exam reveals no gallop and no friction rub.   No murmur heard. Pulmonary/Chest: Effort normal and breath sounds normal. She has no wheezes. She has no rales.  Abdominal: Soft. Bowel sounds are normal. She exhibits no distension. There is no tenderness. There is no guarding. No hernia.  Musculoskeletal: Normal range of motion. She exhibits no edema, tenderness or deformity.  Lymphadenopathy:    She has no cervical adenopathy.  Neurological: She is alert and oriented to person, place, and time. Coordination normal.  Skin: Skin is warm and dry. Capillary refill takes less than 2 seconds. No rash noted.  Psychiatric: She has a normal mood and affect. Her behavior is normal.  Nursing note and  vitals reviewed.    Musculoskeletal Exam:  Full range of motion of all joints Grip strength is equal and strong bilaterally Fibromyalgia tender points are all absent today  CDAI Exam: CDAI Homunculus Exam:   Joint Counts:  CDAI Tender Joint count: 0 CDAI Swollen Joint count: 0  No synovitis. Some DIP PIP prominence. Heberden's node noted to some DIP joints. They do cause patient some pain intermittently.   Investigation: No additional findings. No visits with results within 6 Month(s) from this visit.  Latest known visit with results is:    Orders Only on 03/27/2016  Component Date Value Ref Range Status  . WBC 03/27/2016 8.1  3.8 - 10.8 K/uL Final  . RBC 03/27/2016 4.26  3.80 - 5.10 MIL/uL Final  . Hemoglobin 03/27/2016 12.3  11.7 - 15.5 g/dL Final  . HCT 03/27/2016 38.2  35.0 - 45.0 % Final  . MCV 03/27/2016 89.7  80.0 - 100.0 fL Final  . MCH 03/27/2016 28.9  27.0 - 33.0 pg Final  . MCHC 03/27/2016 32.2  32.0 - 36.0 g/dL Final  . RDW 03/27/2016 13.5  11.0 - 15.0 % Final  . Platelets 03/27/2016 295  140 - 400 K/uL Final  . MPV 03/27/2016 8.9  7.5 - 12.5 fL Final  . Neutro Abs 03/27/2016 5022  1,500 - 7,800 cells/uL Final  . Lymphs Abs 03/27/2016 2511  850 - 3,900 cells/uL Final  . Monocytes Absolute 03/27/2016 486  200 - 950 cells/uL Final  . Eosinophils Absolute 03/27/2016 81  15 - 500 cells/uL Final  . Basophils Absolute 03/27/2016 0  0 - 200 cells/uL Final  . Neutrophils Relative % 03/27/2016 62  % Final  . Lymphocytes Relative 03/27/2016 31  % Final  . Monocytes Relative 03/27/2016 6  % Final  . Eosinophils Relative 03/27/2016 1  % Final  . Basophils Relative 03/27/2016 0  % Final  . Smear Review 03/27/2016 Criteria for review not met   Final  . Sodium 03/27/2016 139  135 - 146 mmol/L Final  . Potassium 03/27/2016 4.1  3.5 - 5.3 mmol/L Final  . Chloride 03/27/2016 103  98 - 110 mmol/L Final  . CO2 03/27/2016 29  20 - 31 mmol/L Final  . Glucose, Bld 03/27/2016 120* 65 - 99 mg/dL Final  . BUN 03/27/2016 15  7 - 25 mg/dL Final  . Creat 03/27/2016 0.77  0.50 - 0.99 mg/dL Final   Comment:   For patients > or = 64 years of age: The upper reference limit for Creatinine is approximately 13% higher for people identified as African-American.     . Total Bilirubin 03/27/2016 0.3  0.2 - 1.2 mg/dL Final  . Alkaline Phosphatase 03/27/2016 58  33 - 130 U/L Final  . AST 03/27/2016 25  10 - 35 U/L Final  . ALT 03/27/2016 20  6 - 29 U/L Final  . Total Protein 03/27/2016 6.7  6.1 - 8.1 g/dL Final  . Albumin 03/27/2016 4.0   3.6 - 5.1 g/dL Final  . Calcium 03/27/2016 9.5  8.6 - 10.4 mg/dL Final  . GFR, Est African American 03/27/2016 >89  >=60 mL/min Final  . GFR, Est Non African American 03/27/2016 82  >=60 mL/min Final     Imaging: No results found.  Speciality Comments: No specialty comments available.    Procedures:  No procedures performed Allergies: Patient has no known allergies.   Assessment / Plan:     Visit Diagnoses: Myofacial muscle pain - Plan: CBC with Differential/Platelet,  COMPLETE METABOLIC PANEL WITH GFR, VITAMIN D 25 Hydroxy (Vit-D Deficiency, Fractures)  Primary insomnia - Plan: CBC with Differential/Platelet, COMPLETE METABOLIC PANEL WITH GFR, VITAMIN D 25 Hydroxy (Vit-D Deficiency, Fractures)  Other fatigue - Plan: VITAMIN D 25 Hydroxy (Vit-D Deficiency, Fractures)  Trapezius muscle spasm  Bilateral chronic knee pain  DDD (degenerative disc disease), cervical  DDD (degenerative disc disease), lumbar  Primary osteoarthritis of both hands  Encounter for vitamin deficiency screening - Plan: VITAMIN D 25 Hydroxy (Vit-D Deficiency, Fractures)   Plan: #1: Fibromyalgia. Active disease with dry skin. Currently doing really well this week. No pain from fibromyalgia. She's been exercising and gradually increasing the quantity of exercise. She is also pacing herself.  #2: Fatigue and insomnia.  #3: No trapezius muscle spasm at this time what she had in the past.  #4: OA of the hands. Some DIP PIP prominence Occasional hand pain. Uses Voltaren gel with good relief.  #5: CBC with differential, CMP with GFR, vitamin D due today. We screening patient for vitamin D deficiency. She is getting bone density done through her GYNs office. She had done last year and she said that she was osteopenic. She does not know her T score or her bone mineralization. She will get [place] a copy.  #6: Return to clinic in 6 months  Orders: Orders Placed This Encounter  Procedures  . CBC with  Differential/Platelet  . COMPLETE METABOLIC PANEL WITH GFR  . VITAMIN D 25 Hydroxy (Vit-D Deficiency, Fractures)   No orders of the defined types were placed in this encounter.   Face-to-face time spent with patient was 30 minutes. 50% of time was spent in counseling and coordination of care.  Follow-Up Instructions: Return in about 6 months (around 03/29/2017) for [place] (gyn office).   Eliezer Lofts, PA-C  Note - This record has been created using [employer].  Chart creation errors have been sought, but may not always  have been located. Such creation errors do not reflect on  the standard of medical care.

## 2016-09-27 ENCOUNTER — Encounter: Payer: Self-pay | Admitting: Rheumatology

## 2016-09-27 ENCOUNTER — Ambulatory Visit (INDEPENDENT_AMBULATORY_CARE_PROVIDER_SITE_OTHER): Payer: BLUE CROSS/BLUE SHIELD | Admitting: Rheumatology

## 2016-09-27 VITALS — BP 124/62 | HR 74 | Resp 16 | Ht 58.5 in | Wt 94.0 lb

## 2016-09-27 DIAGNOSIS — M7918 Myalgia, other site: Secondary | ICD-10-CM

## 2016-09-27 DIAGNOSIS — M19042 Primary osteoarthritis, left hand: Secondary | ICD-10-CM

## 2016-09-27 DIAGNOSIS — F5101 Primary insomnia: Secondary | ICD-10-CM | POA: Diagnosis not present

## 2016-09-27 DIAGNOSIS — M25562 Pain in left knee: Secondary | ICD-10-CM

## 2016-09-27 DIAGNOSIS — M5136 Other intervertebral disc degeneration, lumbar region: Secondary | ICD-10-CM | POA: Diagnosis not present

## 2016-09-27 DIAGNOSIS — Z1321 Encounter for screening for nutritional disorder: Secondary | ICD-10-CM | POA: Diagnosis not present

## 2016-09-27 DIAGNOSIS — M25561 Pain in right knee: Secondary | ICD-10-CM

## 2016-09-27 DIAGNOSIS — M5030 Other cervical disc degeneration, unspecified cervical region: Secondary | ICD-10-CM | POA: Diagnosis not present

## 2016-09-27 DIAGNOSIS — R5383 Other fatigue: Secondary | ICD-10-CM | POA: Diagnosis not present

## 2016-09-27 DIAGNOSIS — M19041 Primary osteoarthritis, right hand: Secondary | ICD-10-CM

## 2016-09-27 DIAGNOSIS — M62838 Other muscle spasm: Secondary | ICD-10-CM

## 2016-09-27 DIAGNOSIS — G8929 Other chronic pain: Secondary | ICD-10-CM | POA: Diagnosis not present

## 2016-09-27 DIAGNOSIS — M791 Myalgia: Secondary | ICD-10-CM | POA: Diagnosis not present

## 2016-09-27 LAB — CBC WITH DIFFERENTIAL/PLATELET
BASOS ABS: 0 {cells}/uL (ref 0–200)
BASOS PCT: 0 %
EOS ABS: 288 {cells}/uL (ref 15–500)
Eosinophils Relative: 4 %
HCT: 36.4 % (ref 35.0–45.0)
HEMOGLOBIN: 11.8 g/dL (ref 11.7–15.5)
LYMPHS ABS: 2304 {cells}/uL (ref 850–3900)
Lymphocytes Relative: 32 %
MCH: 29.2 pg (ref 27.0–33.0)
MCHC: 32.4 g/dL (ref 32.0–36.0)
MCV: 90.1 fL (ref 80.0–100.0)
MONOS PCT: 6 %
MPV: 8.9 fL (ref 7.5–12.5)
Monocytes Absolute: 432 cells/uL (ref 200–950)
NEUTROS ABS: 4176 {cells}/uL (ref 1500–7800)
Neutrophils Relative %: 58 %
Platelets: 304 10*3/uL (ref 140–400)
RBC: 4.04 MIL/uL (ref 3.80–5.10)
RDW: 13.6 % (ref 11.0–15.0)
WBC: 7.2 10*3/uL (ref 3.8–10.8)

## 2016-09-27 LAB — COMPLETE METABOLIC PANEL WITH GFR
ALBUMIN: 4 g/dL (ref 3.6–5.1)
ALK PHOS: 62 U/L (ref 33–130)
ALT: 19 U/L (ref 6–29)
AST: 25 U/L (ref 10–35)
BILIRUBIN TOTAL: 0.2 mg/dL (ref 0.2–1.2)
BUN: 12 mg/dL (ref 7–25)
CO2: 25 mmol/L (ref 20–31)
CREATININE: 0.82 mg/dL (ref 0.50–0.99)
Calcium: 9.3 mg/dL (ref 8.6–10.4)
Chloride: 103 mmol/L (ref 98–110)
GFR, Est African American: 88 mL/min (ref >=60)
GFR, Est Non African American: 76 mL/min (ref >=60)
GLUCOSE: 105 mg/dL — AB (ref 65–99)
Potassium: 5.1 mmol/L (ref 3.5–5.3)
Sodium: 139 mmol/L (ref 135–146)
TOTAL PROTEIN: 6.6 g/dL (ref 6.1–8.1)

## 2016-09-28 LAB — VITAMIN D 25 HYDROXY (VIT D DEFICIENCY, FRACTURES): Vit D, 25-Hydroxy: 37 ng/mL (ref 30–100)

## 2016-09-29 ENCOUNTER — Telehealth: Payer: Self-pay | Admitting: Radiology

## 2016-09-29 NOTE — Telephone Encounter (Signed)
I have called patient to advise labs are normal  

## 2016-09-29 NOTE — Telephone Encounter (Signed)
-----   Message from [place], [place] sent at 09/28/2016 11:44 AM EDT ----- Please send these labs to PCP And please tell patient #1: Vitamin D is normal at 37 #2: CBC with differential is within normal #3: CMP with GFR is within normal limits except nonfasting glucose is at 105

## 2016-10-09 ENCOUNTER — Other Ambulatory Visit: Payer: Self-pay | Admitting: Rheumatology

## 2016-10-10 NOTE — Telephone Encounter (Signed)
Last Visit: 09/27/16 Next Visit: 04/03/17  Okay to refill Eszopiclone?

## 2016-10-12 ENCOUNTER — Encounter: Payer: Self-pay | Admitting: Gastroenterology

## 2016-11-01 DIAGNOSIS — M8589 Other specified disorders of bone density and structure, multiple sites: Secondary | ICD-10-CM | POA: Insufficient documentation

## 2016-11-01 DIAGNOSIS — M249 Joint derangement, unspecified: Secondary | ICD-10-CM | POA: Insufficient documentation

## 2016-11-01 DIAGNOSIS — R7611 Nonspecific reaction to tuberculin skin test without active tuberculosis: Secondary | ICD-10-CM | POA: Insufficient documentation

## 2016-11-01 DIAGNOSIS — M5134 Other intervertebral disc degeneration, thoracic region: Secondary | ICD-10-CM | POA: Insufficient documentation

## 2016-11-01 DIAGNOSIS — R768 Other specified abnormal immunological findings in serum: Secondary | ICD-10-CM | POA: Insufficient documentation

## 2016-11-01 NOTE — Progress Notes (Signed)
Office Visit Note  Patient: Victoria Watson             Date of Birth: 1951-02-13           MRN: [id number]             PCP: Maurice Small, MD Referring: Maurice Small, MD Visit Date: 11/02/2016 Occupation: @GUAROCC @    Subjective:  Neck pain.   History of Present Illness: Victoria Watson is a 66 y.o. female with history of myofascial pain and this disease. She states she's been having some discomfort in the left trapezius area. She's been taking care of her mother who felt lately and also has been taking care of her 2 grandchildren. There is no history of radiculopathy. She's not having much discomfort in the thoracic and lower lumbar region. Sounds she wants to have it lidocaine injection to her left trapezius area.  Activities of Daily Living:  Patient reports morning stiffness for 0 minute.   Patient Reports nocturnal pain.  Difficulty dressing/grooming: Denies Difficulty climbing stairs: Denies Difficulty getting out of chair: Denies Difficulty using hands for taps, buttons, cutlery, and/or writing: Denies   Review of Systems  Constitutional: Positive for fatigue. Negative for night sweats, weight gain, weight loss and weakness.  HENT: Negative for mouth sores, trouble swallowing, trouble swallowing, mouth dryness and nose dryness.   Eyes: Positive for dryness. Negative for pain, redness and visual disturbance.  Respiratory: Negative for cough, shortness of breath and difficulty breathing.   Cardiovascular: Negative for chest pain, palpitations, hypertension, irregular heartbeat and swelling in legs/feet.  Gastrointestinal: Negative for blood in stool, constipation and diarrhea.  Endocrine: Negative for increased urination.  Genitourinary: Negative for vaginal dryness.  Musculoskeletal: Positive for arthralgias and joint pain. Negative for joint swelling, myalgias, muscle weakness, morning stiffness, muscle tenderness and myalgias.  Skin: Negative for color  change, rash, hair loss, skin tightness, ulcers and sensitivity to sunlight.  Allergic/Immunologic: Negative for susceptible to infections.  Neurological: Negative for dizziness, memory loss and night sweats.  Hematological: Negative for swollen glands.  Psychiatric/Behavioral: Negative for depressed mood and sleep disturbance. The patient is not nervous/anxious.     PMFS History:  Patient Active Problem List   Diagnosis Date Noted  . Hypermobility of joint 11/01/2016  . DDD (degenerative disc disease), thoracic 11/01/2016  . Osteopenia of multiple sites 11/01/2016  . Positive PPD treated with INH  11/01/2016  . Rheumatoid factor positive -CCP  11/01/2016  . Myofacial muscle pain 03/28/2016  . Insomnia 03/28/2016  . Other fatigue 03/28/2016  . DDD (degenerative disc disease), lumbar 03/28/2016  . DDD (degenerative disc disease), cervical 03/28/2016  . Primary osteoarthritis of both hands 03/28/2016  . Bronchiectasis ([facility]) 07/09/2015  . Wheezing 06/16/2015  . Chest crackles 06/16/2015    Past Medical History:  Diagnosis Date  . Fibromyalgia   . Insomnia   . OA (osteoarthritis)   . Positive PPD    Took INH tabs x 3 months    Family History  Problem Relation Age of Onset  . Heart disease Father   . COPD Father   . Heart disease Paternal Grandmother   . Heart disease Paternal Grandfather    Past Surgical History:  Procedure Laterality Date  . TONSILLECTOMY    . tooth implant Left   . TOTAL ABDOMINAL HYSTERECTOMY  1990   Social History   Social History Narrative  . No narrative on file     Objective: Vital Signs: BP 128/72   Pulse  80   Resp 14   Ht 4' 10.5" (1.486 m)   Wt 94 lb (42.6 kg)   BMI 19.31 kg/m    Physical Exam  Constitutional: She is oriented to person, place, and time. She appears well-developed and well-nourished.  HENT:  Head: Normocephalic and atraumatic.  Eyes: Conjunctivae and EOM are normal.  Neck: Normal range of motion.    Cardiovascular: Normal rate, regular rhythm, normal heart sounds and intact distal pulses.   Pulmonary/Chest: Effort normal and breath sounds normal.  Abdominal: Soft. Bowel sounds are normal.  Lymphadenopathy:    She has no cervical adenopathy.  Neurological: She is alert and oriented to person, place, and time.  Skin: Skin is warm and dry. Capillary refill takes less than 2 seconds.  Psychiatric: She has a normal mood and affect. Her behavior is normal.  Nursing note and vitals reviewed.    Musculoskeletal Exam: C-spine and thoracic lumbar spine good range of motion. She is some stiffness with range of motion of her spine. She had tenderness over bilateral trapezius area with spasm. Shoulder joints elbow joints wrist joints are good range of motion. She has DIP PIP thickening in her hands consistent with osteoarthritis. Hip joints knee joints ankles MTPs PIPs with good range of motion with no synovitis.  CDAI Exam: No CDAI exam completed.    Investigation: No additional findings. CBC Latest Ref Rng & Units 09/27/2016 03/27/2016  WBC 3.8 - 10.8 K/uL 7.2 8.1  Hemoglobin 11.7 - 15.5 g/dL 81.111.8 91.412.3  Hematocrit 78.235.0 - 45.0 % 36.4 38.2  Platelets 140 - 400 K/uL 304 295   CMP Latest Ref Rng & Units 09/27/2016 03/27/2016 02/15/2010  Glucose 65 - 99 mg/dL 956(O105(H) 130(Q120(H) 87  BUN 7 - 25 mg/dL 12 15 10   Creatinine 0.50 - 0.99 mg/dL 6.570.82 8.460.77 9.620.77  Sodium 135 - 146 mmol/L 139 139 141  Potassium 3.5 - 5.3 mmol/L 5.1 4.1 4.1  Chloride 98 - 110 mmol/L 103 103 108  CO2 20 - 31 mmol/L 25 29 27   Calcium 8.6 - 10.4 mg/dL 9.3 9.5 9.3  Total Protein 6.1 - 8.1 g/dL 6.6 6.7 -  Total Bilirubin 0.2 - 1.2 mg/dL 0.2 0.3 -  Alkaline Phos 33 - 130 U/L 62 58 -  AST 10 - 35 U/L 25 25 -  ALT 6 - 29 U/L 19 20 -    Imaging: No results found.  Speciality Comments: No specialty comments available.    Procedures:  Trigger Point Inj Date/Time: 11/02/2016 1:35 PM Performed by: Pollyann SavoyEVESHWAR, SHAILI Authorized  by: Pollyann SavoyEVESHWAR, SHAILI   Consent Given by:  Patient Site marked: the procedure site was marked   Timeout: prior to procedure the correct patient, procedure, and site was verified   Indications:  Muscle spasm and pain Total # of Trigger Points:  2 Location: neck   Needle Size:  27 G Approach:  Dorsal Medications #1:  0.5 mL lidocaine 1 % Medications #2:  0.5 mL lidocaine 1 % Patient tolerance:  Patient tolerated the procedure well with no immediate complications   Allergies: Patient has no known allergies.   Assessment / Plan:     Visit Diagnoses: Myofascial pain syndrome: She continues to have some stiffness and generalized pain.  Other fatigue: The fatigue is tolerable  Neck pain: She's been having increased neck discomfort in the trapezius area. After informed consent was obtain bilateral trapezius area were injected with lidocaine per request procedures described above.  Primary osteoarthritis of both hands: Joint protection  and muscle strengthening discussed.  DDD (degenerative disc disease), cervical: She continues to have some chronic pain  DDD (degenerative disc disease), thoracic: The pain is tolerable: The pain is tolerable  DDD (degenerative disc disease), lumbar  Hypermobility of joint  Osteopenia of multiple sites  Positive PPD treated with INH   Rheumatoid factor positive -CCP     Orders: Orders Placed This Encounter  Procedures  . Trigger Point Injection   No orders of the defined types were placed in this encounter.   Face-to-face time spent with patient was 30 minutes. 50% of time was spent in counseling and coordination of care.  Follow-Up Instructions: No Follow-up on file.   Pollyann Savoy, MD  Note - This record has been created using [employer].  Chart creation errors have been sought, but may not always  have been located. Such creation errors do not reflect on  the standard of medical care.

## 2016-11-02 ENCOUNTER — Ambulatory Visit (INDEPENDENT_AMBULATORY_CARE_PROVIDER_SITE_OTHER): Payer: BLUE CROSS/BLUE SHIELD | Admitting: Rheumatology

## 2016-11-02 ENCOUNTER — Encounter: Payer: Self-pay | Admitting: Rheumatology

## 2016-11-02 VITALS — BP 128/72 | HR 80 | Resp 14 | Ht 58.5 in | Wt 94.0 lb

## 2016-11-02 DIAGNOSIS — M791 Myalgia: Secondary | ICD-10-CM

## 2016-11-02 DIAGNOSIS — M249 Joint derangement, unspecified: Secondary | ICD-10-CM

## 2016-11-02 DIAGNOSIS — M5030 Other cervical disc degeneration, unspecified cervical region: Secondary | ICD-10-CM | POA: Diagnosis not present

## 2016-11-02 DIAGNOSIS — M19041 Primary osteoarthritis, right hand: Secondary | ICD-10-CM

## 2016-11-02 DIAGNOSIS — R768 Other specified abnormal immunological findings in serum: Secondary | ICD-10-CM

## 2016-11-02 DIAGNOSIS — M5136 Other intervertebral disc degeneration, lumbar region: Secondary | ICD-10-CM

## 2016-11-02 DIAGNOSIS — M7918 Myalgia, other site: Secondary | ICD-10-CM

## 2016-11-02 DIAGNOSIS — M5134 Other intervertebral disc degeneration, thoracic region: Secondary | ICD-10-CM | POA: Diagnosis not present

## 2016-11-02 DIAGNOSIS — R5383 Other fatigue: Secondary | ICD-10-CM | POA: Diagnosis not present

## 2016-11-02 DIAGNOSIS — M542 Cervicalgia: Secondary | ICD-10-CM

## 2016-11-02 DIAGNOSIS — R7611 Nonspecific reaction to tuberculin skin test without active tuberculosis: Secondary | ICD-10-CM | POA: Diagnosis not present

## 2016-11-02 DIAGNOSIS — M51369 Other intervertebral disc degeneration, lumbar region without mention of lumbar back pain or lower extremity pain: Secondary | ICD-10-CM

## 2016-11-02 DIAGNOSIS — M19042 Primary osteoarthritis, left hand: Secondary | ICD-10-CM | POA: Diagnosis not present

## 2016-11-02 DIAGNOSIS — M8589 Other specified disorders of bone density and structure, multiple sites: Secondary | ICD-10-CM

## 2016-11-02 MED ORDER — LIDOCAINE HCL 1 % IJ SOLN
0.5000 mL | INTRAMUSCULAR | Status: AC | PRN
  Administered 2016-11-02: .5 mL

## 2016-11-17 ENCOUNTER — Other Ambulatory Visit: Payer: Self-pay | Admitting: Rheumatology

## 2016-11-17 NOTE — Telephone Encounter (Signed)
ok 

## 2016-11-17 NOTE — Telephone Encounter (Signed)
Last Visit: 11/02/16 Next Visit:05/08/17 UDS: 09/21/15 Narc Agreement: 09/06/15 Last Fill: 08/24/16  Okay to refill Tramadol?

## 2017-01-04 ENCOUNTER — Other Ambulatory Visit: Payer: Self-pay | Admitting: Rheumatology

## 2017-01-05 NOTE — Telephone Encounter (Signed)
11/02/16 last visit  Next visit 04/28/17   ok to refill Lunesta ?

## 2017-01-06 ENCOUNTER — Other Ambulatory Visit: Payer: Self-pay | Admitting: Rheumatology

## 2017-01-08 NOTE — Telephone Encounter (Signed)
11/02/16 last visit  Next visit 04/28/17  Labs: 09/27/16 WNL  Okay to refill per Dr. Corliss Skains

## 2017-02-12 ENCOUNTER — Encounter: Payer: Self-pay | Admitting: Gastroenterology

## 2017-02-14 ENCOUNTER — Other Ambulatory Visit: Payer: Self-pay | Admitting: Rheumatology

## 2017-02-14 DIAGNOSIS — Z5181 Encounter for therapeutic drug level monitoring: Secondary | ICD-10-CM

## 2017-02-14 NOTE — Telephone Encounter (Signed)
ok 

## 2017-02-14 NOTE — Telephone Encounter (Signed)
Last Visit: 11/02/16 Next Visit:05/08/17 UDS: 09/21/15 Narc Agreement: 09/06/15 Patient will come in this week to update narcotic agreement and UDS  Okay to refill Tramadol?

## 2017-02-15 ENCOUNTER — Other Ambulatory Visit: Payer: Self-pay

## 2017-02-15 DIAGNOSIS — Z5181 Encounter for therapeutic drug level monitoring: Secondary | ICD-10-CM

## 2017-02-17 LAB — PAIN MGMT, PROFILE 5 W/CONF, U
Amphetamines: NEGATIVE ng/mL (ref <500)
BARBITURATES: NEGATIVE ng/mL (ref <300)
Benzodiazepines: NEGATIVE ng/mL (ref <100)
Cocaine Metabolite: NEGATIVE ng/mL (ref <150)
Creatinine: 50.6 mg/dL
Marijuana Metabolite: NEGATIVE ng/mL (ref <20)
Methadone Metabolite: NEGATIVE ng/mL (ref <100)
Opiates: NEGATIVE ng/mL (ref <100)
Oxidant: NEGATIVE ug/mL (ref <200)
Oxycodone: NEGATIVE ng/mL (ref <100)
pH: 6.03 (ref 4.5–9.0)

## 2017-02-17 LAB — PAIN MGMT, TRAMADOL W/MEDMATCH, U
Desmethyltramadol: 322 ng/mL — ABNORMAL HIGH (ref <100)
Tramadol: 1995 ng/mL — ABNORMAL HIGH (ref <100)

## 2017-02-18 NOTE — Progress Notes (Signed)
C/w

## 2017-03-29 ENCOUNTER — Other Ambulatory Visit: Payer: Self-pay | Admitting: Rheumatology

## 2017-03-30 NOTE — Telephone Encounter (Addendum)
11/02/16 last visit  Next visit 04/28/17  Labs: 09/27/16 WNL  Okay to refill 30 day supply per Dr. Corliss Skainseveshwar

## 2017-04-03 ENCOUNTER — Ambulatory Visit: Payer: BLUE CROSS/BLUE SHIELD | Admitting: Rheumatology

## 2017-04-04 NOTE — Telephone Encounter (Signed)
Per patient she cuts Meloxicam tabs in half, and does not need a refill at this time. Patient will call when needs refill.

## 2017-04-05 ENCOUNTER — Encounter: Payer: 59 | Admitting: Gastroenterology

## 2017-04-06 ENCOUNTER — Telehealth: Payer: Self-pay

## 2017-04-06 ENCOUNTER — Other Ambulatory Visit: Payer: Self-pay

## 2017-04-06 MED ORDER — ESZOPICLONE 3 MG PO TABS: ORAL_TABLET | ORAL | 2 refills | Status: DC

## 2017-04-06 NOTE — Telephone Encounter (Signed)
ok 

## 2017-04-06 NOTE — Addendum Note (Signed)
Addended by: Henriette CombsHATTON, Jhonny Calixto L on: 04/06/2017 10:00 AM   Modules accepted: Orders

## 2017-04-06 NOTE — Telephone Encounter (Signed)
Patient advised prescription was faxed to the pharmacy.  

## 2017-04-06 NOTE — Telephone Encounter (Signed)
Patient would like a Rx refill on Eszopiclone 3mg . [facility] [address].  CB# is [phone].  Please advise.  Thank you.

## 2017-04-06 NOTE — Telephone Encounter (Signed)
11/02/16 last visit  Next visit 04/28/17  Okay to refill Lunesta?

## 2017-04-06 NOTE — Telephone Encounter (Signed)
Patient calling concerning her Rx refill.  Cb# is [phone]

## 2017-04-24 NOTE — Progress Notes (Signed)
Office Visit Note  Patient: Victoria Watson             Date of Birth: [birth year]           MRN: [id number]             PCP: Maurice Small, MD Referring: Maurice Small, MD Visit Date: 05/08/2017 Occupation: @GUAROCC @    Subjective:  Trapezius muscle spasms    History of Present Illness: Victoria Watson is a 67 y.o. female with myofascial pain syndrome, osteoarthritis, and DDD.  Patient states she has been doing very well over the past 2 weeks.  She is having decreased muscle tenderness and muscle tension.  She has been sleeping better with Lunesta.  She denies any back pain or neck pain at this time.  She states she has trapezius muscle tenderness, but it is not as severe as it has been in the past.  She has been using her massage therapy and Voltaren gel in this region, which have been helping.  She continues to use Tramadol 1 tablet BID PRN and Mobic 7.5 mg PRN.  She needs a refill of tramadol.  She reports she is having oral surgery at the end of February.    Activities of Daily Living:  Patient reports morning stiffness for 1.5  hours.   Patient Reports nocturnal pain.  Difficulty dressing/grooming: Reports Difficulty climbing stairs: Reports Difficulty getting out of chair: Reports Difficulty using hands for taps, buttons, cutlery, and/or writing: Reports   Review of Systems  Constitutional: Positive for fatigue. Negative for weakness.  HENT: Negative for mouth sores, mouth dryness and nose dryness.   Eyes: Negative for redness and dryness.  Respiratory: Negative for cough, shortness of breath and difficulty breathing.   Cardiovascular: Negative for chest pain, palpitations, hypertension, irregular heartbeat and swelling in legs/feet.  Gastrointestinal: Negative for blood in stool, constipation and diarrhea.  Endocrine: Negative.   Genitourinary: Positive for nocturia.  Musculoskeletal: Positive for arthralgias, joint pain, joint swelling and morning stiffness.  Negative for myalgias, muscle weakness, muscle tenderness and myalgias.  Skin: Positive for hair loss. Negative for color change, rash, nodules/bumps, ulcers and sensitivity to sunlight.  Neurological: Negative for dizziness, numbness and headaches.  Hematological: Negative for swollen glands.  Psychiatric/Behavioral: Negative for depressed mood and sleep disturbance. The patient is not nervous/anxious.     PMFS History:  Patient Active Problem List   Diagnosis Date Noted  . Hypermobility of joint 11/01/2016  . DDD (degenerative disc disease), thoracic 11/01/2016  . Osteopenia of multiple sites 11/01/2016  . Positive PPD treated with INH  11/01/2016  . Rheumatoid factor positive -CCP  11/01/2016  . Myofacial muscle pain 03/28/2016  . Insomnia 03/28/2016  . Other fatigue 03/28/2016  . DDD (degenerative disc disease), lumbar 03/28/2016  . DDD (degenerative disc disease), cervical 03/28/2016  . Primary osteoarthritis of both hands 03/28/2016  . Bronchiectasis (HCC) 07/09/2015  . Wheezing 06/16/2015  . Chest crackles 06/16/2015    Past Medical History:  Diagnosis Date  . Fibromyalgia   . Insomnia   . OA (osteoarthritis)   . Positive PPD    Took INH tabs x 3 months    Family History  Problem Relation Age of Onset  . Heart disease Father   . COPD Father   . Heart disease Paternal Grandmother   . Heart disease Paternal Grandfather    Past Surgical History:  Procedure Laterality Date  . TONSILLECTOMY    . tooth implant Left   .  TOTAL ABDOMINAL HYSTERECTOMY  1990   Social History   Social History Narrative  . Not on file     Objective: Vital Signs: BP 128/64   Pulse 83   Resp 14   Ht 4' 10.5" (1.486 m)   Wt 96 lb (43.5 kg)   BMI 19.72 kg/m    Physical Exam  Constitutional: She is oriented to person, place, and time. She appears well-developed and well-nourished.  HENT:  Head: Normocephalic and atraumatic.  Eyes: Conjunctivae and EOM are normal.  Neck: Normal  range of motion.  Cardiovascular: Normal rate, regular rhythm, normal heart sounds and intact distal pulses.  Pulmonary/Chest: Effort normal. She has wheezes.  Abdominal: Soft. Bowel sounds are normal.  Lymphadenopathy:    She has no cervical adenopathy.  Neurological: She is alert and oriented to person, place, and time.  Skin: Skin is warm and dry. Capillary refill takes less than 2 seconds.  Psychiatric: She has a normal mood and affect. Her behavior is normal.  Nursing note and vitals reviewed.    Musculoskeletal Exam: C-spine limited ROM with lateral rotation.  Thoracic and lumbar good ROM.  Shoulder joints, elbow joints, wrist joints, MCPs, PIPs, and DIPs good ROM with no synovitis.  Hip joints, knee joints, ankle joints, MTPs, PIPs, and DIPs good ROM with no synovitis.  She has right knee crepitus.  No effusion or warmth of knee joints.  Mild trochanteric bursa tenderness.  No SI joint tenderness or midline spinal tenderness.  She is tender in the bilateral trapezius region.    CDAI Exam: No CDAI exam completed.    Investigation: No additional findings. UDS: 02/15/2017 Narc agreement: 02/15/2017  Imaging: No results found.  Speciality Comments: Narcotic Agreement 02/15/17    Procedures:  No procedures performed Allergies: Patient has no known allergies.   Assessment / Plan:     Visit Diagnoses: Myofascial pain syndrome -Clinically she is doing very well.  She was flaring over the holidays, but she has been doing significantly better over the past 2 weeks.  She continues to have trapezius muscle tenderness and tension, but it is not severe at this time.  She declined a cortisone trigger point injection today.  She uses voltaren gel and massage therapy.  She was given a refill of tramadol 50, 1 tablet BID as needed for pain relief.  UDS and narcotic agreement are up to date.  tramadol UDS: 10/25/2018Narc agreement: 02/15/2017  Other fatigue: Improved.  Other insomnia:  Improved.  She takes Lunesta at bedtime.    Lateral epicondylitis, bilateral elbows: She has tenderness over the lateral epicondyles bilaterally.  She has been taking care of her granddaughter who is 434 months old more frequently lately, and she attributes it to lifting her more.  She is going to use voltaren gel.  She declined exercises at this time.  She was also made aware that if it does not resolve she can use braces, go to PT, or get a cortisone injection.    Primary osteoarthritis of both hands: PIP and DIP synovial thickening consistent with osteoarthritis.  She has no discomfort at this time.    DDD (degenerative disc disease), cervical: She has limited ROM with lateral rotation.  She also has trapezius muscle tenderness and occasional muscle spasms.  She declined a cortisone trigger point injection at this time.    DDD (degenerative disc disease), lumbar: Stable. No midline spinal tenderness.    DDD (degenerative disc disease), thoracic: Stable.  No midline spinal tenderness.  Osteopenia of multiple sites: She continues to take Calcium and Vitamin D supplements.   Rheumatoid factor positive -CCP: No synovitis or MCP tenderness.    Hypermobility of joint  Positive PPD treated with INH    Orders: No orders of the defined types were placed in this encounter.  Meds ordered this encounter  Medications  . traMADol (ULTRAM) 50 MG tablet    Sig: Take 1 tablet (50 mg total) by mouth 2 (two) times daily as needed.    Dispense:  60 tablet    Refill:  0     Follow-Up Instructions: Return in about 6 months (around 11/05/2017) for Fibromyalgia.   Pollyann Savoy, MD  Note - This record has been created using Animal nutritionist.  Chart creation errors have been sought, but may not always  have been located. Such creation errors do not reflect on  the standard of medical care.

## 2017-05-08 ENCOUNTER — Ambulatory Visit: Payer: BLUE CROSS/BLUE SHIELD | Admitting: Rheumatology

## 2017-05-08 ENCOUNTER — Encounter: Payer: Self-pay | Admitting: Rheumatology

## 2017-05-08 VITALS — BP 128/64 | HR 83 | Resp 14 | Ht 58.5 in | Wt 96.0 lb

## 2017-05-08 DIAGNOSIS — R768 Other specified abnormal immunological findings in serum: Secondary | ICD-10-CM

## 2017-05-08 DIAGNOSIS — R5383 Other fatigue: Secondary | ICD-10-CM | POA: Diagnosis not present

## 2017-05-08 DIAGNOSIS — R7611 Nonspecific reaction to tuberculin skin test without active tuberculosis: Secondary | ICD-10-CM | POA: Diagnosis not present

## 2017-05-08 DIAGNOSIS — M5136 Other intervertebral disc degeneration, lumbar region: Secondary | ICD-10-CM | POA: Diagnosis not present

## 2017-05-08 DIAGNOSIS — M51369 Other intervertebral disc degeneration, lumbar region without mention of lumbar back pain or lower extremity pain: Secondary | ICD-10-CM

## 2017-05-08 DIAGNOSIS — G4709 Other insomnia: Secondary | ICD-10-CM

## 2017-05-08 DIAGNOSIS — M7918 Myalgia, other site: Secondary | ICD-10-CM | POA: Diagnosis not present

## 2017-05-08 DIAGNOSIS — M249 Joint derangement, unspecified: Secondary | ICD-10-CM

## 2017-05-08 DIAGNOSIS — M5134 Other intervertebral disc degeneration, thoracic region: Secondary | ICD-10-CM

## 2017-05-08 DIAGNOSIS — M5030 Other cervical disc degeneration, unspecified cervical region: Secondary | ICD-10-CM

## 2017-05-08 DIAGNOSIS — M7711 Lateral epicondylitis, right elbow: Secondary | ICD-10-CM

## 2017-05-08 DIAGNOSIS — M8589 Other specified disorders of bone density and structure, multiple sites: Secondary | ICD-10-CM

## 2017-05-08 DIAGNOSIS — M19042 Primary osteoarthritis, left hand: Secondary | ICD-10-CM

## 2017-05-08 DIAGNOSIS — M7712 Lateral epicondylitis, left elbow: Secondary | ICD-10-CM | POA: Diagnosis not present

## 2017-05-08 DIAGNOSIS — M19041 Primary osteoarthritis, right hand: Secondary | ICD-10-CM

## 2017-05-08 MED ORDER — TRAMADOL HCL 50 MG PO TABS: 50.0000 mg | ORAL_TABLET | Freq: Two times a day (BID) | ORAL | 0 refills | Status: DC | PRN

## 2017-05-09 ENCOUNTER — Other Ambulatory Visit: Payer: Self-pay | Admitting: Rheumatology

## 2017-05-09 NOTE — Telephone Encounter (Signed)
Last Visit: 05/08/17 Next Visit: 11/12/17 UDS: 02/15/18 Narc Agreement: 02/15/18  Okay to refill Tramadol?

## 2017-05-09 NOTE — Telephone Encounter (Signed)
Patient called stating that she needed a prescription refill for her Tramadol.  Patient's CB [phone]

## 2017-05-09 NOTE — Telephone Encounter (Signed)
A refill of tramadol was sent to the pharmacy yesterday.  It shows the prescription refill was received by the pharmacy.

## 2017-05-10 NOTE — Telephone Encounter (Signed)
Patient states she called back about her prescription because she would like to have her prescription for the way it was originally written. Patient states the Tramadol has  always been written for 1 po TID. Patient states she take it three times when she is more active, on vacation or can't sleep at night. Patient would like for us to change the prescription. Patient she also had to pay more for the 60 pills than the 90.

## 2017-05-10 NOTE — Telephone Encounter (Signed)
Left message to advise patient prescription was sent to the pharmacy on 05/08/17

## 2017-05-10 NOTE — Telephone Encounter (Signed)
I had detailed discussion with the patient. She is willing to reduce her tramadol to 2 tablets a day. She will take Tylenol when necessary if needed. She states her insurance company will reduce the price of the tramadol prescription if she is given 90 day supply in future. We can refill her medicines in future for 90 day supply if approved by the insurance.

## 2017-05-14 IMAGING — CT CT CHEST HIGH RESOLUTION W/O CM
2 of 5 series · 15 of 36 positions shown, 18 images · non-contrast
Comparison: 07/08/2014 chest radiograph. No prior chest CT.
02/15/2010 CT abdomen.

CLINICAL DATA: Wheezing and chest crackles.

EXAM:
CT CHEST WITHOUT CONTRAST
TECHNIQUE: Multidetector CT imaging of the chest was performed following the
standard protocol without intravenous contrast. High resolution
imaging of the lungs, as well as inspiratory and expiratory imaging,
was performed.

[Series 4: high resolution · axial · 0.53mm/px · z∈[-238,+2]mm · 12 of 56 slices shown, 15 images]
[im 4/56  mediastinal]
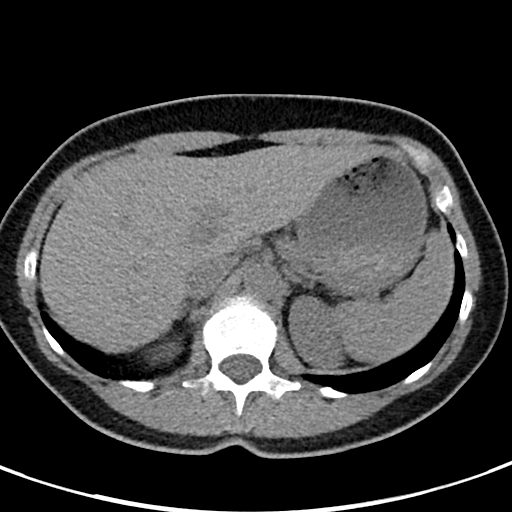
[im 4/56  lung]
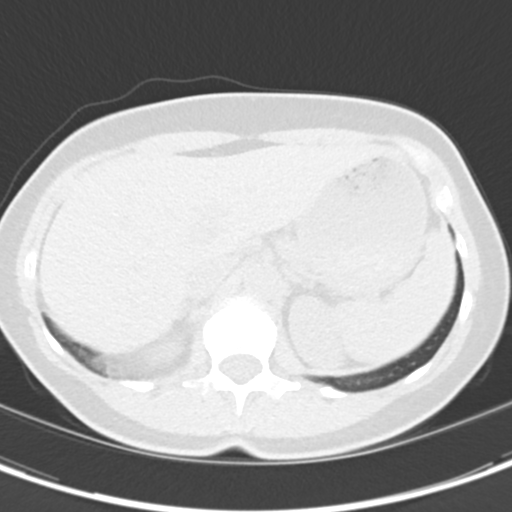
[im 10/56  lung]
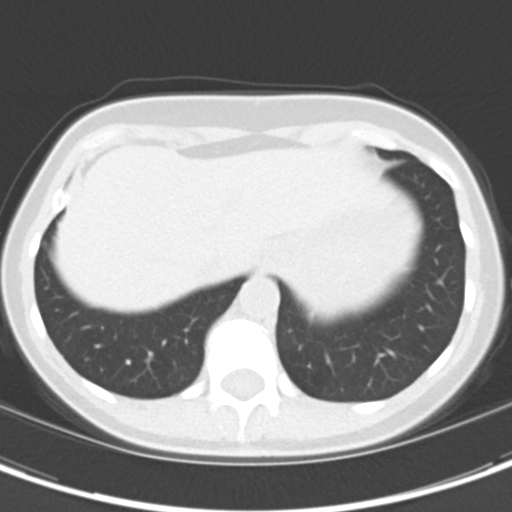
[im 13/56  lung]
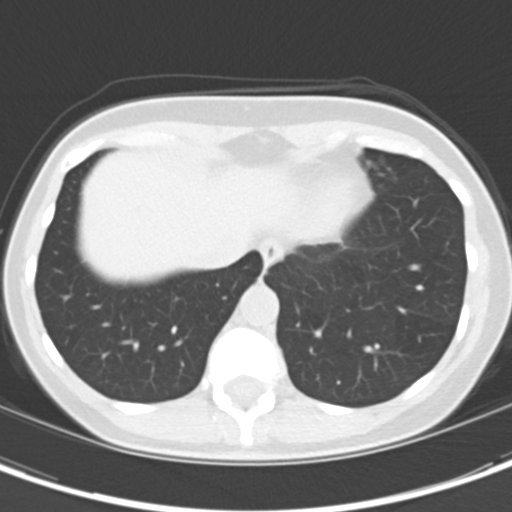
[im 17/56  lung]
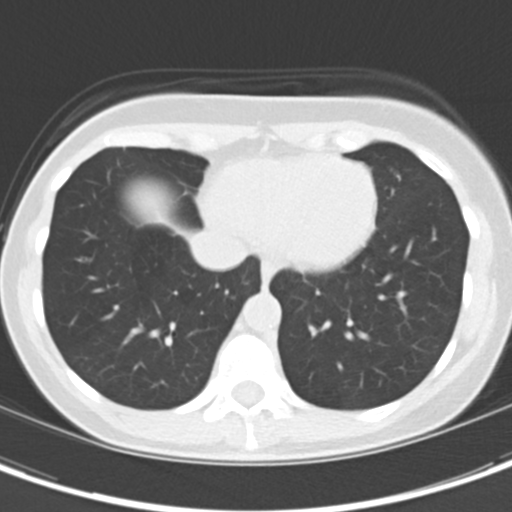
[im 23/56  mediastinal]
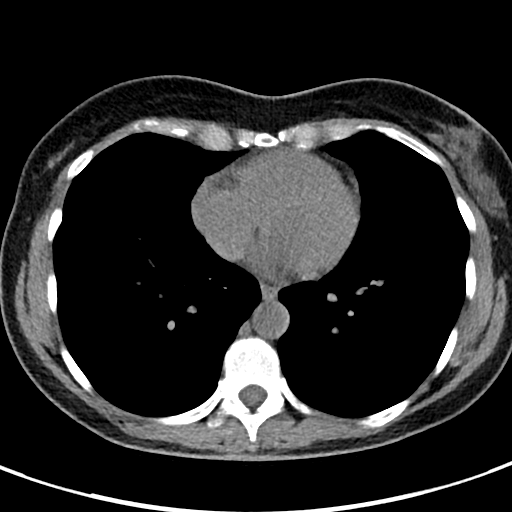
[im 23/56  lung]
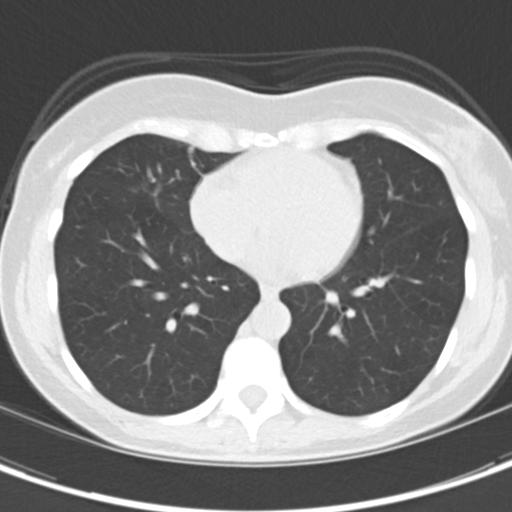
[im 26/56  lung]
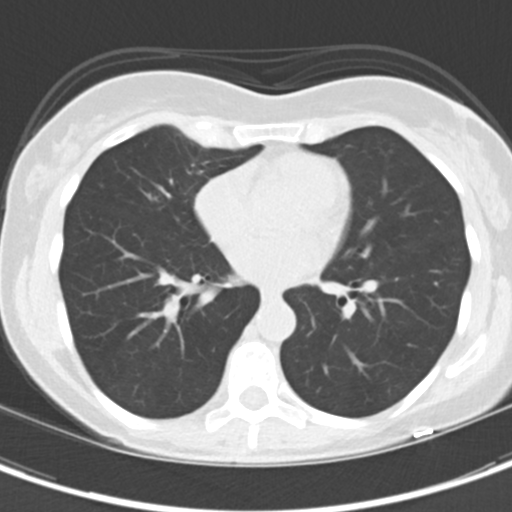
[im 30/56  lung]
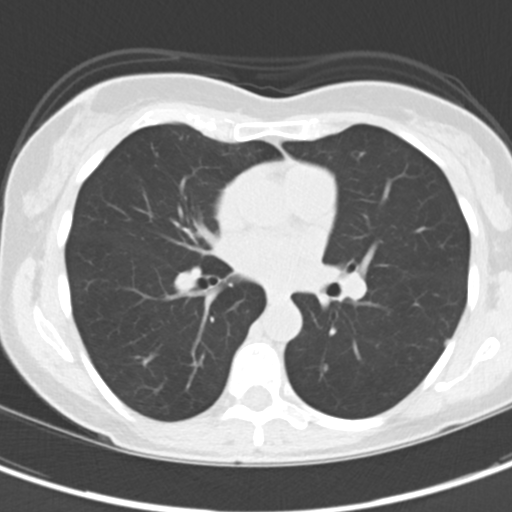
[im 36/56  lung]
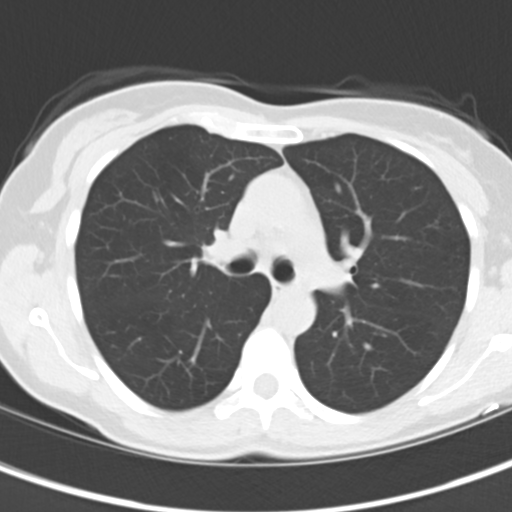
[im 39/56  mediastinal]
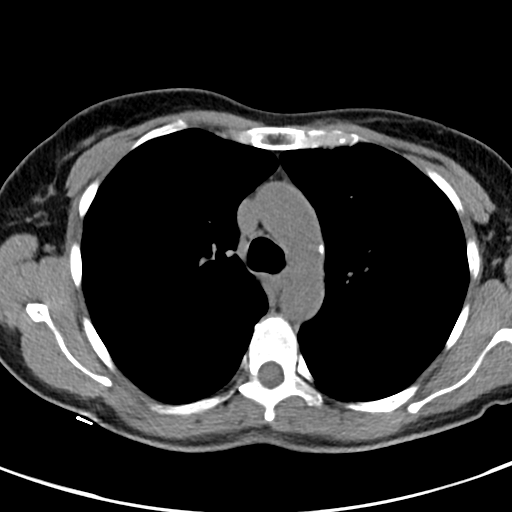
[im 39/56  lung]
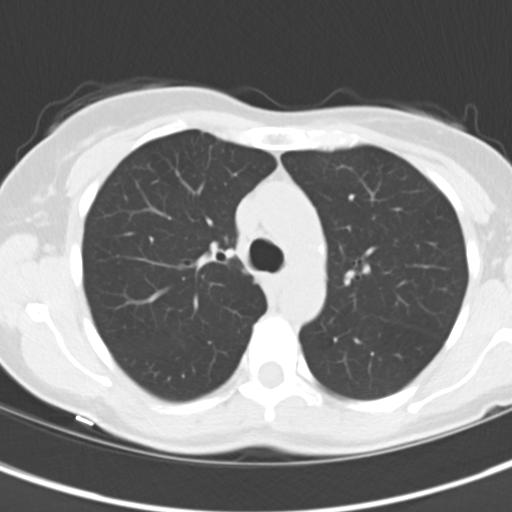
[im 43/56  lung]
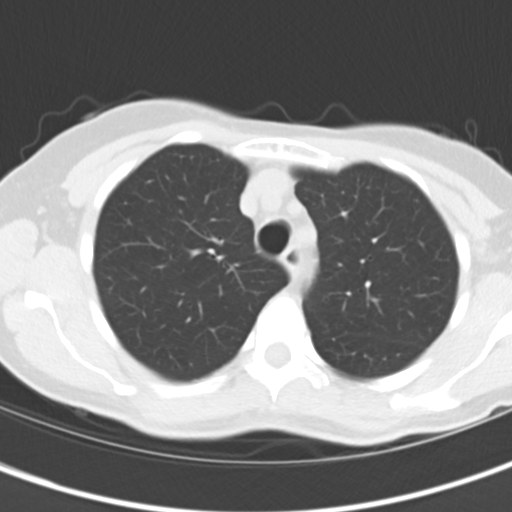
[im 49/56  lung]
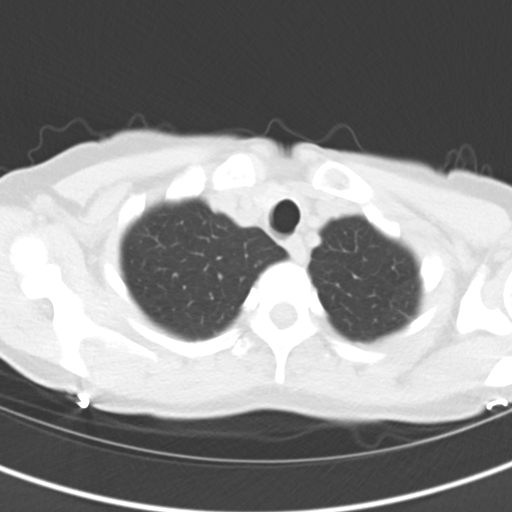
[im 52/56  lung]
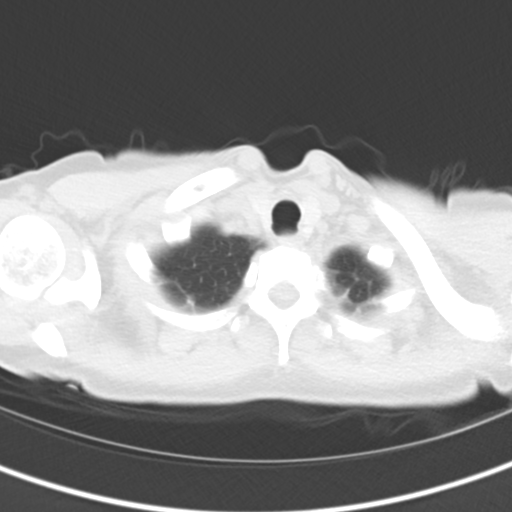

[Series 8: coronal · coronal · 0.54mm/px · 3 of 97 slices shown]
[im 20/97  lung]
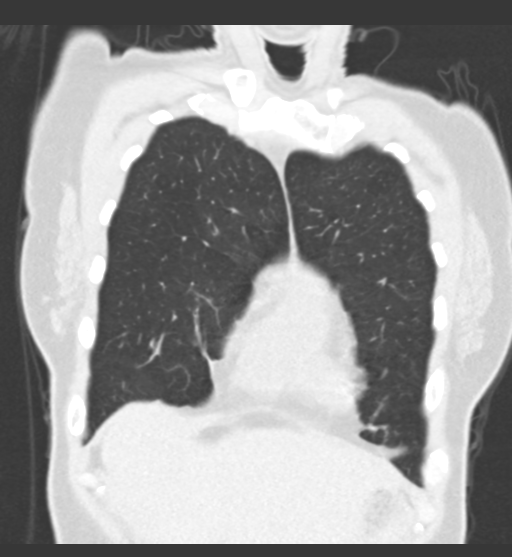
[im 39/97  lung]
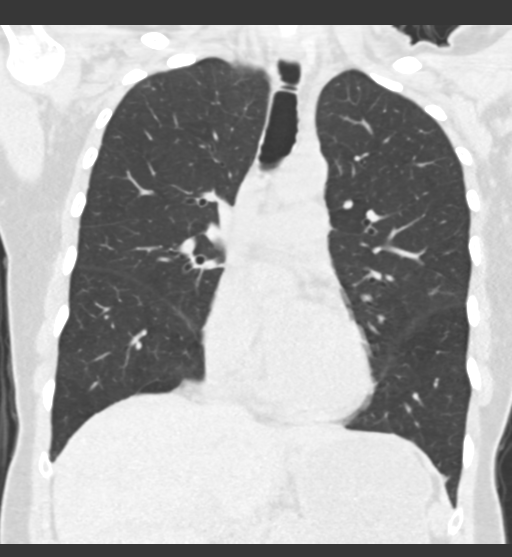
[im 58/97  lung]
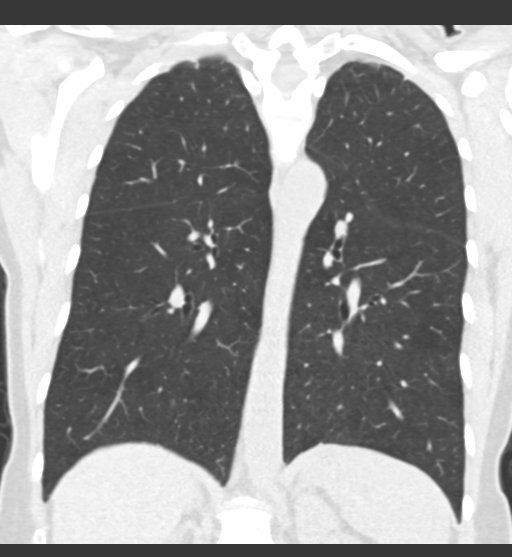

[15 of 36 positions shown; findings below may reference images not displayed]

FINDINGS: Mediastinum/Nodes: Normal heart size. No pericardial
fluid/thickening. Great vessels are normal in course and caliber.
Normal visualized thyroid. Normal esophagus. No pathologically
enlarged axillary, mediastinal or gross hilar lymph nodes, noting
limited sensitivity for the detection of hilar adenopathy on this
noncontrast study.

Lungs/Pleura: No pneumothorax. No pleural effusion. No calcified
pleural plaques. Left lower 4 mm subpleural pulmonary nodule (series
5/ image 27). Separate 4 mm solid left lower lobe pulmonary nodule
(series 5/ image 27). No acute consolidative airspace disease,
additional significant pulmonary nodules or lung masses. No
significant regions of subpleural reticulation, ground-glass
attenuation, traction bronchiectasis, architectural distortion or
frank honeycombing. There is mild cylindrical bronchiectasis in the
right middle lobe with associated small parenchymal bands in keeping
with mild areas of postinfectious/ postinflammatory scarring. There
is extensive air trapping throughout both lungs on the expiration
sequence.

Upper abdomen: Unremarkable.

Musculoskeletal: No aggressive appearing focal osseous lesions. Mild
degenerative changes in the thoracic spine.
IMPRESSION: 1. No evidence of interstitial lung disease.
2. Extensive air trapping throughout both lungs, indicating small
airways disease.
3. Mild cylindrical bronchiectasis and scarring in the right middle
lobe.
4. Two left lower lobe 4 mm pulmonary nodules. If the patient is at
high risk for bronchogenic carcinoma, follow-up chest CT at 1 year
is recommended. If the patient is at low risk, no follow-up is
needed. This recommendation follows the consensus statement:
Guidelines for Management of Small Pulmonary Nodules Detected on CT
Scans: A Statement from the [HOSPITAL] as published in

## 2017-06-13 ENCOUNTER — Other Ambulatory Visit: Payer: Self-pay | Admitting: Physician Assistant

## 2017-06-15 NOTE — Telephone Encounter (Signed)
Last Visit: 05/08/17 Next Visit: 11/12/17 UDS: 02/15/18 Narc Agreement: 02/15/18  Okay to refill Tramadol?  

## 2017-07-03 ENCOUNTER — Other Ambulatory Visit: Payer: Self-pay | Admitting: Rheumatology

## 2017-07-03 NOTE — Telephone Encounter (Signed)
Last Visit: 05/08/17 Next Visit:11/12/17  Last Fill: 04/06/17  Okay to refill Eszopiclone?

## 2017-07-04 ENCOUNTER — Telehealth: Payer: Self-pay | Admitting: Rheumatology

## 2017-07-04 NOTE — Telephone Encounter (Signed)
Patient called stating that [facility] will not refill the prescription of Eszopiclone due to not having an authorized signature.  [facility] [address] [facility] Sherron A[place] was not an authorized signature for the medication.

## 2017-07-04 NOTE — Telephone Encounter (Signed)
Spoke with the pharmacist and gave verbal under Dr. Corliss Skainseveshwar. Patient aware.

## 2017-07-12 ENCOUNTER — Other Ambulatory Visit: Payer: Self-pay | Admitting: Physician Assistant

## 2017-07-12 NOTE — Telephone Encounter (Signed)
Last Visit: 05/08/17  Next Visit: 11/12/17  UDS: 02/15/17  Narc Agreement: 02/15/17  Okay to refill Tramadol?

## 2017-07-16 ENCOUNTER — Other Ambulatory Visit: Payer: Self-pay | Admitting: Physician Assistant

## 2017-07-20 ENCOUNTER — Emergency Department (HOSPITAL_COMMUNITY)
Admission: EM | Admit: 2017-07-20 | Discharge: 2017-07-20 | Disposition: A | Discharge status: Home / Self Care | Payer: BLUE CROSS/BLUE SHIELD | Attending: Emergency Medicine | Admitting: Emergency Medicine

## 2017-07-20 ENCOUNTER — Other Ambulatory Visit: Payer: Self-pay

## 2017-07-20 ENCOUNTER — Encounter (HOSPITAL_COMMUNITY): Payer: Self-pay

## 2017-07-20 DIAGNOSIS — S0181XA Laceration without foreign body of other part of head, initial encounter: Secondary | ICD-10-CM | POA: Insufficient documentation

## 2017-07-20 DIAGNOSIS — W01190A Fall on same level from slipping, tripping and stumbling with subsequent striking against furniture, initial encounter: Secondary | ICD-10-CM | POA: Diagnosis not present

## 2017-07-20 DIAGNOSIS — R55 Syncope and collapse: Secondary | ICD-10-CM | POA: Insufficient documentation

## 2017-07-20 DIAGNOSIS — E860 Dehydration: Secondary | ICD-10-CM

## 2017-07-20 DIAGNOSIS — Y999 Unspecified external cause status: Secondary | ICD-10-CM | POA: Diagnosis not present

## 2017-07-20 DIAGNOSIS — Z79899 Other long term (current) drug therapy: Secondary | ICD-10-CM | POA: Insufficient documentation

## 2017-07-20 DIAGNOSIS — Y92512 Supermarket, store or market as the place of occurrence of the external cause: Secondary | ICD-10-CM | POA: Insufficient documentation

## 2017-07-20 DIAGNOSIS — Y939 Activity, unspecified: Secondary | ICD-10-CM | POA: Insufficient documentation

## 2017-07-20 LAB — URINALYSIS, ROUTINE W REFLEX MICROSCOPIC
BILIRUBIN URINE: NEGATIVE
Glucose, UA: NEGATIVE mg/dL
Hgb urine dipstick: NEGATIVE
KETONES UR: 20 mg/dL — AB
Leukocytes, UA: NEGATIVE
NITRITE: NEGATIVE
PROTEIN: NEGATIVE mg/dL
Specific Gravity, Urine: 1.009 (ref 1.005–1.030)
pH: 5 (ref 5.0–8.0)

## 2017-07-20 LAB — BASIC METABOLIC PANEL
ANION GAP: 13 (ref 5–15)
BUN: 16 mg/dL (ref 6–20)
CALCIUM: 9.3 mg/dL (ref 8.9–10.3)
CO2: 23 mmol/L (ref 22–32)
CREATININE: 1.01 mg/dL — AB (ref 0.44–1.00)
Chloride: 105 mmol/L (ref 101–111)
GFR, EST NON AFRICAN AMERICAN: 58 mL/min — AB (ref >60)
GLUCOSE: 92 mg/dL (ref 65–99)
Potassium: 4.4 mmol/L (ref 3.5–5.1)
Sodium: 141 mmol/L (ref 135–145)

## 2017-07-20 LAB — CBG MONITORING, ED: GLUCOSE-CAPILLARY: 89 mg/dL (ref 65–99)

## 2017-07-20 LAB — CBC
HCT: 40.1 % (ref 36.0–46.0)
HEMOGLOBIN: 12.7 g/dL (ref 12.0–15.0)
MCH: 29.5 pg (ref 26.0–34.0)
MCHC: 31.7 g/dL (ref 30.0–36.0)
MCV: 93 fL (ref 78.0–100.0)
PLATELETS: 287 10*3/uL (ref 150–400)
RBC: 4.31 MIL/uL (ref 3.87–5.11)
RDW: 13.8 % (ref 11.5–15.5)
WBC: 10.5 10*3/uL (ref 4.0–10.5)

## 2017-07-20 MED ORDER — SODIUM CHLORIDE 0.9 % IV BOLUS
1000.0000 mL | Freq: Once | INTRAVENOUS | Status: AC
  Administered 2017-07-20: 1000 mL via INTRAVENOUS

## 2017-07-20 MED ORDER — BACITRACIN ZINC 500 UNIT/GM EX OINT
TOPICAL_OINTMENT | Freq: Once | CUTANEOUS | Status: AC
  Administered 2017-07-20: 1 via TOPICAL
  Filled 2017-07-20: qty 0.9

## 2017-07-20 MED ORDER — LIDOCAINE-EPINEPHRINE (PF) 2 %-1:200000 IJ SOLN
10.0000 mL | Freq: Once | INTRAMUSCULAR | Status: AC
  Administered 2017-07-20: 10 mL
  Filled 2017-07-20: qty 20

## 2017-07-20 NOTE — ED Notes (Signed)
Pt ambulated to bathroom with no assistance.  

## 2017-07-20 NOTE — ED Provider Notes (Signed)
[facility]-EMERGENCY DEPT Provider Note   CSN: [id number] Arrival date & time: 07/20/17  1407     History   Chief Complaint Chief Complaint  Patient presents with  . Loss of Consciousness    HPI Victoria Watson is a 67 y.o. female.  Victoria Watson is a 67 y.o. Female with a history of fibromyalgia, osteoarthritis and insomnia who presents to the ED via EMS for evaluation after syncopal episode in [facility] [employer] today.  Patient reports she was checking out and became hot and flushed and then passed out striking her chin on the checkout counter.  Per EMS patient was found on the floor alert and oriented x4 and verbally responsive, she was described as cold and clammy with bleeding laceration to the chin.  Patient reports she recently had oral surgery and has not been able to eat any solid foods has primarily been drinking smoothies and has had decreased oral intake in general.  Patient reports she had not had anything to eat for several hours when she was out running errands, and had been feeling a bit lightheaded.  EMS checked patient's vitals when she was laying down and her blood pressure was 112/62 upon standing up per EMS patient had another syncopal episode in transit she was given 700 cc bolus of normal saline, and on arrival patient feels much better.  She denies any prodromal chest pain or shortness of breath and reports she is currently symptom-free.  Patient denies any headache, dizziness, vision changes, neck pain.  She denies any nausea vomiting or abdominal pain.  She has no cardiac history has not had any similar history of prior syncopal episodes.  Patient reports she has not had any water to drink today.  She does report her tetanus shot is up-to-date.     Past Medical History:  Diagnosis Date  . Fibromyalgia   . Insomnia   . OA (osteoarthritis)   . Positive PPD    Took INH tabs x 3 months    Patient Active Problem List   Diagnosis  Date Noted  . Hypermobility of joint 11/01/2016  . DDD (degenerative disc disease), thoracic 11/01/2016  . Osteopenia of multiple sites 11/01/2016  . Positive PPD treated with INH  11/01/2016  . Rheumatoid factor positive -CCP  11/01/2016  . Myofacial muscle pain 03/28/2016  . Insomnia 03/28/2016  . Other fatigue 03/28/2016  . DDD (degenerative disc disease), lumbar 03/28/2016  . DDD (degenerative disc disease), cervical 03/28/2016  . Primary osteoarthritis of both hands 03/28/2016  . Bronchiectasis (HCC) 07/09/2015  . Wheezing 06/16/2015  . Chest crackles 06/16/2015    Past Surgical History:  Procedure Laterality Date  . TONSILLECTOMY    . tooth implant Left   . TOTAL ABDOMINAL HYSTERECTOMY  1990     OB History   None      Home Medications    Prior to Admission medications   Medication Sig Start Date End Date Taking? Authorizing Provider  albuterol (PROAIR HFA) 108 (90 Base) MCG/ACT inhaler Inhale 2 puffs into the lungs every 6 (six) hours as needed for wheezing or shortness of breath. 07/05/15   Parrett, Virgel Bouquet, NP  Biotin 5 MG TABS Take by mouth daily.    [provider]  Calcium Carbonate-Vitamin D (CALCIUM-VITAMIN D) 500-200 MG-UNIT tablet Take 1 tablet by mouth daily.     [provider]  cycloSPORINE (RESTASIS) 0.05 % ophthalmic emulsion Place 1 drop into both eyes 2 (two) times daily.  [provider]  diclofenac sodium (VOLTAREN) 1 % GEL Apply 2-4 g topically 4 (four) times daily.    [provider]  estradiol (ESTRACE) 1 MG tablet Take 1 mg by mouth daily.    [provider]  Eszopiclone 3 MG TABS take 1 tablet by mouth once daily immediately BEFORE BEDTIME 07/03/17   Gearldine Bienenstockale, Taylor M, PA-C  fluticasone [facility](FLONASE) 50 MCG/ACT nasal spray Place into both nostrils as needed for allergies or rhinitis.    [provider]  Folic Acid-Vit B6-Vit B12 (TL GARD RX) 2.2-25-1 MG TABS Take 1 tablet by mouth daily. 07/28/16    Panwala, Naitik, PA-C  Magnesium 250 MG TABS Take by mouth daily.    [provider]  meloxicam (MOBIC) 15 MG tablet TAKE 1 TABLET BY MOUTH ONCE DAILY 03/30/17   Pollyann Savoyeveshwar, Shaili, MD  Multiple Vitamins-Minerals (MULTIVITAMIN WITH MINERALS) tablet Take 1 tablet by mouth daily.    [provider]  Spacer/Aero-Holding Chambers (AEROCHAMBER MV) inhaler Use as instructed 07/05/15   Parrett, Virgel Bouquetammy S, NP  traMADol (ULTRAM) 50 MG tablet take 1 tablet by mouth twice a day if needed 06/15/17   Gearldine Bienenstockale, Taylor M, PA-C  traMADol Janean Sark(ULTRAM) 50 MG tablet take 1 tablet by mouth twice a day if needed 07/13/17   Gearldine Bienenstockale, Taylor M, PA-C  vitamin C (ASCORBIC ACID) 500 MG tablet Take 500 mg by mouth daily.    [provider]    Family History Family History  Problem Relation Age of Onset  . Heart disease Father   . COPD Father   . Heart disease Paternal Grandmother   . Heart disease Paternal Grandfather     Social History Social History   Tobacco Use  . Smoking status: Never Smoker  . Smokeless tobacco: Never Used  Substance Use Topics  . Alcohol use: No    Alcohol/week: 0.0 oz  . Drug use: No     Allergies   Patient has no known allergies.   Review of Systems Review of Systems  Constitutional: Negative for chills and fever.  HENT: Negative for congestion, dental problem, rhinorrhea, sinus pressure and sore throat.   Eyes: Negative for visual disturbance.  Respiratory: Negative for cough, chest tightness, shortness of breath and wheezing.   Cardiovascular: Negative for chest pain and palpitations.  Gastrointestinal: Negative for abdominal pain, diarrhea, nausea and vomiting.  Genitourinary: Negative for dysuria and frequency.  Musculoskeletal: Negative for arthralgias and myalgias.  Skin: Positive for wound. Negative for color change, pallor and rash.  Neurological: Positive for syncope and light-headedness. Negative for dizziness, facial asymmetry, speech difficulty,  weakness, numbness and headaches.     Physical Exam Updated Vital Signs There were no vitals taken for this visit.  Physical Exam  Constitutional: She is oriented to person, place, and time. She appears well-developed and well-nourished. No distress.  HENT:  Head: Normocephalic and atraumatic.  Oropharynx clear, mucous membranes appear slightly dry.  No malocclusion of the jaw, 2.5 cm laceration to the underside of the chin No scalp hematomas, step-off or deformity, no hemotympanum or CSF otorrhea bilaterally  Eyes: Pupils are equal, round, and reactive to light. EOM are normal. Right eye exhibits no discharge. Left eye exhibits no discharge.  No nystagmus  Neck: Neck supple.  Cardiovascular: Normal rate, regular rhythm, normal heart sounds and intact distal pulses. Exam reveals no gallop and no friction rub.  No murmur heard. Pulses:      Radial pulses are 2+ on the right side, and 2+  on the left side.       Dorsalis pedis pulses are 2+ on the right side, and 2+ on the left side.  Pulmonary/Chest: Effort normal and breath sounds normal. No stridor. No respiratory distress. She has no wheezes. She has no rales.  Respirations equal and unlabored, patient able to speak in full sentences, lungs clear to auscultation bilaterally  Abdominal: Soft. Bowel sounds are normal. She exhibits no distension and no mass. There is no tenderness. There is no guarding.  Musculoskeletal: She exhibits no edema or deformity.  Neurological: She is alert and oriented to person, place, and time. Coordination normal.  Speech is clear, able to follow commands CN III-XII intact Normal strength in upper and lower extremities bilaterally including dorsiflexion and plantar flexion, strong and equal grip strength Sensation normal to light and sharp touch Moves extremities without ataxia, coordination intact Normal finger to nose and rapid alternating movements No pronator drift  Skin: Skin is warm and dry.  Capillary refill takes less than 2 seconds. She is not diaphoretic.  2.5 cm laceration to the underside of the chin, bleeding controlled no foreign bodies present  Psychiatric: She has a normal mood and affect. Her behavior is normal.  Nursing note and vitals reviewed.    ED Treatments / Results  Labs (all labs ordered are listed, but only abnormal results are displayed) Labs Reviewed  BASIC METABOLIC PANEL - Abnormal; Notable for the following components:      Result Value   Creatinine, Ser 1.01 (*)    GFR calc non Af Amer 58 (*)    All other components within normal limits  URINALYSIS, ROUTINE W REFLEX MICROSCOPIC - Abnormal; Notable for the following components:   Color, Urine STRAW (*)    Ketones, ur 20 (*)    All other components within normal limits  CBC  CBG MONITORING, ED    EKG ED ECG REPORT   Date: 07/20/2017  Rate: 75  Rhythm: normal sinus rhythm  QRS Axis: normal  Intervals: normal  ST/T Wave abnormalities: normal  Conduction Disutrbances:none  Narrative Interpretation:   Old EKG Reviewed: none available  I have personally reviewed the EKG tracing and agree with the computerized printout as noted.  Radiology No results found.  Procedures .Marland KitchenLaceration Repair Date/Time: 07/20/2017 4:20 PM Performed by: Dartha Lodge, PA-C Authorized by: Dartha Lodge, PA-C   Consent:    Consent obtained:  Verbal   Consent given by:  Patient   Risks discussed:  Infection, pain and poor cosmetic result   Alternatives discussed:  No treatment Anesthesia (see MAR for exact dosages):    Anesthesia method:  Local infiltration   Local anesthetic:  Lidocaine 2% WITH epi Laceration details:    Location:  Face   Face location:  Chin   Length (cm):  2.5   Depth (mm):  3 Repair type:    Repair type:  Simple Pre-procedure details:    Preparation:  Patient was prepped and draped in usual sterile fashion Exploration:    Hemostasis achieved with:  Epinephrine and direct  pressure   Wound exploration: entire depth of wound probed and visualized     Wound extent: areolar tissue violated   Treatment:    Area cleansed with:  Saline and Shur-Clens   Amount of cleaning:  Standard   Irrigation solution:  Sterile saline   Irrigation volume:  20   Irrigation method:  Syringe Skin repair:    Repair method:  Sutures   Suture size:  5-0   Suture material:  Prolene   Suture technique:  Simple interrupted   Number of sutures:  4 Approximation:    Approximation:  Close Post-procedure details:    Dressing:  Antibiotic ointment and adhesive bandage   Patient tolerance of procedure:  Tolerated well, no immediate complications   (including critical care time)  Medications Ordered in ED Medications  sodium chloride 0.9 % bolus 1,000 mL (0 mLs Intravenous Stopped 07/20/17 1643)  lidocaine-EPINEPHrine (XYLOCAINE W/EPI) 2 %-1:200000 (PF) injection 10 mL (10 mLs Infiltration Given 07/20/17 1600)  bacitracin ointment (1 application Topical Given 07/20/17 1657)     Initial Impression / Assessment and Plan / ED Course  I have reviewed the triage vital signs and the nursing notes.  Pertinent labs & imaging results that were available during my care of the patient were reviewed by me and considered in my medical decision making (see chart for details).  Patient presents to the ED via EMS for evaluation of syncopal episode.  Recent oral surgery, decreased oral intake, patient began to feel hot and flushed and then passed out she is she sustained 2.5 cm laceration to the chin.  No chest pain or shortness of breath.  On the scene patient was orthostatic with EMS and had a second syncopal episode when attempting to stand.  Received 700 cc bolus with improvement in blood pressure and symptoms.  EKG, basic labs and urinalysis, additional liter of fluid given here in the ED.  Chin laceration amenable to repair with sutures, anesthetized with local infiltration, irrigated with normal  saline and repaired with simple interrupted sutures, good cosmesis and tolerated well by the patient, tetanus up-to-date.  Patient ambulatory without assistance with steady gait throughout the department with no additional presyncopal symptoms.  Feel patient's syncope was likely orthostatic and due to dehydration.  Given normal EKG and no cardiac history I have low suspicion for arrhythmia or other cardiac etiology.  Patient to follow-up with her primary doctor, stitches to be removed in 5-7 days.  Strict return precautions discussed.  Patient expressed understanding and is in agreement with plan.  Final Clinical Impressions(s) / ED Diagnoses   Final diagnoses:  Syncope, unspecified syncope type  Dehydration  Chin laceration, initial encounter    ED Discharge Orders    None       Legrand Rams 07/21/17 Claris Pong    Lorre Nick, MD 07/21/17 [id number]

## 2017-07-20 NOTE — ED Triage Notes (Signed)
Pt arrived via EMS fnd in a [facility] dept store. Pt was checking out became hot and flushed. Pt passed out and struck her chin on counter. Per EMS pt was fnd on floor alert and oriented x 4 and verbally responsive. Pt was described as cold and clammy. Pt reports some pain to chin, no other complaints. Pt recently had oral surgery done and has reported  poor oral intake.  ** EMS checked v/s when pt was lying and was 112/62, upon standing pt up, pt per EMS had another syncopal episode.  18G left  AC  700cc of normal saline enroute,   V/S 122/62 Hr 72, RR 16, 100% RA CBG 118

## 2017-07-20 NOTE — ED Notes (Signed)
Suture area cleansed, applied bacitracin, and covered with non adherent dressing secured with tegaderm. Reviewed s/s of infection.

## 2017-07-20 NOTE — ED Notes (Signed)
Pt has a laceration noted to lower chin, area cleansed with Normal saline, some minimal bleeding is noted area. gauze applied and paper tape to area/

## 2017-07-20 NOTE — Discharge Instructions (Signed)
Your evaluation today is very reassuring I think you likely passed out today due to the dehydration and a change in your typical diet.  Please make sure you are drinking plenty of water.  Stand up slowly and follow-up closely with your primary doctor.  You will need to have your stitches removed in 5-7 days, you may keep the area covered with antibiotic ointment, you may shower but please do not submerge her head under water.   Return to the emergency department for repeat syncopal episodes, chest pain or shortness of breath, or redness, warmth or drainage from the laceration on your chin.

## 2017-07-20 NOTE — ED Notes (Signed)
Bed: WA02 Expected date:  Expected time:  Means of arrival:  Comments: 67 yo syncope w/ fall

## 2017-07-31 ENCOUNTER — Telehealth: Payer: Self-pay | Admitting: Rheumatology

## 2017-07-31 NOTE — Telephone Encounter (Signed)
Patient called requesting prescription refill of Eszopiclone.  Patient states she is going out of town on 4/11 for 2 weeks.  Patient spoke with [employer] who told her to have Dr. Corliss Skainseveshwar send an early refill authorization on office letterhead and signed (not a stamp) and they will allow to refill early.  [facility]  Fax #  [phone]

## 2017-08-01 NOTE — Telephone Encounter (Signed)
Ok to refill 

## 2017-08-01 NOTE — Telephone Encounter (Signed)
Patient advised pharmacy is getting her prescription ready for her.

## 2017-08-30 ENCOUNTER — Other Ambulatory Visit: Payer: Self-pay | Admitting: *Deleted

## 2017-08-30 MED ORDER — FOLIC ACID-VIT B6-VIT B12 2.2-25-1 MG PO TABS: 1.0000 | ORAL_TABLET | Freq: Every day | ORAL | 3 refills | Status: DC

## 2017-08-30 NOTE — Telephone Encounter (Signed)
Refill request received via fax  Last Visit: 05/08/17  Next Visit: 11/12/17

## 2017-09-05 ENCOUNTER — Telehealth: Payer: Self-pay | Admitting: Rheumatology

## 2017-09-05 MED ORDER — DICLOFENAC SODIUM 1 % TD GEL: TRANSDERMAL | 3 refills | Status: AC

## 2017-09-05 NOTE — Telephone Encounter (Signed)
Last Visit: 05/08/17  Next Visit: 11/12/17   Okay to refill per Dr. Corliss Skains

## 2017-09-05 NOTE — Telephone Encounter (Signed)
Patient called requesting prescription refill of Voltaren Gel.  Patient's pharmacy is [facility] on [address] in [place].

## 2017-10-01 ENCOUNTER — Telehealth: Payer: Self-pay | Admitting: Rheumatology

## 2017-10-01 MED ORDER — ESZOPICLONE 3 MG PO TABS: ORAL_TABLET | ORAL | 2 refills | Status: DC

## 2017-10-01 NOTE — Telephone Encounter (Signed)
Patient called requesting prescription refill of Eszopiclone to be [facility] [address] [place].  Patient states she is out of medication and is requesting that it be called into the pharmacy today.

## 2017-10-01 NOTE — Telephone Encounter (Signed)
Last Visit: 05/08/17  Next Visit: 11/12/17   Okay to refill Lunesta?

## 2017-10-02 NOTE — Telephone Encounter (Signed)
error 

## 2017-10-09 NOTE — Progress Notes (Signed)
Office Visit Note  Patient: Victoria Watson             Date of Birth: Sep 07, 1950           MRN: [id number]             PCP: Maurice Small, MD Referring: Maurice Small, MD Visit Date: 10/10/2017 Occupation: @GUAROCC @    Subjective:  Neck pain   History of Present Illness: Victoria Watson is a 67 y.o. female with history of myofascial pain syndrome and osteoarthritis.  Patient states that she has been having increased neck pain and stiffness since the beginning of March 2019.  She states that on July 20, 2017 she had a syncopal episode sustaining a concussion and having to have 4 stitches in her chin.  She states she passed out due to her blood pressure dropping.  She states since then her neck pain has been worsening.  She states she is having trapezius muscle tension and muscle tenderness as well.  She feels the pain is starting to radiate down her left arm.  She report numbness and tingling down her left arm as well.   She states she has limited ROM of her neck.  She has been having to take more tylenol as well as two tramadols daily for pain relief.  She continues to have chronic lower back pain.  She denies any SI joint pain.  She states she experiences radiation of pain down the left leg as well.  She continues to have bilateral lateral epicondylitis.  She denies any other joint pain or joint swelling.  She continues to have chronic insomnia and takes [place].  She continues to have an interrupted night sleep and does not wake up feeling rested.  She continues to have chronic fatigue.  She tries to stay active but has not been exercising since the concussion at the end of March.    Activities of Daily Living:  Patient reports morning stiffness for 2 hours.   Patient Reports nocturnal pain.  Difficulty dressing/grooming: Denies Difficulty climbing stairs: Denies Difficulty getting out of chair: Denies Difficulty using hands for taps, buttons, cutlery, and/or writing:  Reports   Review of Systems  Constitutional: Positive for fatigue.  HENT: Negative for mouth sores, trouble swallowing, trouble swallowing, mouth dryness and nose dryness.   Eyes: Positive for dryness. Negative for pain and visual disturbance.  Respiratory: Positive for shortness of breath. Negative for cough, hemoptysis and difficulty breathing.   Cardiovascular: Negative for chest pain, palpitations, hypertension and swelling in legs/feet.  Gastrointestinal: Negative for blood in stool, constipation and diarrhea.  Endocrine: Negative for increased urination.  Genitourinary: Negative for difficulty urinating and painful urination.  Musculoskeletal: Positive for arthralgias, joint pain, muscle weakness, morning stiffness and muscle tenderness. Negative for joint swelling, myalgias and myalgias.  Skin: Positive for hair loss. Negative for color change, pallor, rash, nodules/bumps, skin tightness, ulcers and sensitivity to sunlight.  Allergic/Immunologic: Negative for susceptible to infections.  Neurological: Negative for dizziness and headaches.  Hematological: Negative for bruising/bleeding tendency and swollen glands.  Psychiatric/Behavioral: Positive for sleep disturbance. Negative for depressed mood. The patient is not nervous/anxious.     PMFS History:  Patient Active Problem List   Diagnosis Date Noted  . Hypermobility of joint 11/01/2016  . DDD (degenerative disc disease), thoracic 11/01/2016  . Osteopenia of multiple sites 11/01/2016  . Positive PPD treated with INH  11/01/2016  . Rheumatoid factor positive -CCP  11/01/2016  . Myofacial muscle pain 03/28/2016  .  Insomnia 03/28/2016  . Other fatigue 03/28/2016  . DDD (degenerative disc disease), lumbar 03/28/2016  . DDD (degenerative disc disease), cervical 03/28/2016  . Primary osteoarthritis of both hands 03/28/2016  . Bronchiectasis (HCC) 07/09/2015  . Wheezing 06/16/2015  . Chest crackles 06/16/2015    Past Medical  History:  Diagnosis Date  . Fibromyalgia   . Insomnia   . OA (osteoarthritis)   . Positive PPD    Took INH tabs x 3 months    Family History  Problem Relation Age of Onset  . Heart disease Father   . COPD Father   . Heart disease Paternal Grandmother   . Heart disease Paternal Grandfather    Past Surgical History:  Procedure Laterality Date  . TONSILLECTOMY    . tooth implant Left   . TOTAL ABDOMINAL HYSTERECTOMY  1990   Social History   Social History Narrative  . Not on file     Objective: Vital Signs: BP 118/60 (BP Location: Left Arm, Patient Position: Sitting, Cuff Size: Normal)   Pulse 85   Resp 14   Ht 4' 10.5" (1.486 m)   Wt 95 lb (43.1 kg)   BMI 19.52 kg/m    Physical Exam  Constitutional: She is oriented to person, place, and time. She appears well-developed and well-nourished.  HENT:  Head: Normocephalic and atraumatic.  Eyes: Conjunctivae and EOM are normal.  Neck: Normal range of motion.  Cardiovascular: Normal rate, regular rhythm, normal heart sounds and intact distal pulses.  Pulmonary/Chest: Effort normal and breath sounds normal.  Abdominal: Soft. Bowel sounds are normal.  Lymphadenopathy:    She has no cervical adenopathy.  Neurological: She is alert and oriented to person, place, and time.  Skin: Skin is warm and dry. Capillary refill takes less than 2 seconds.  Psychiatric: She has a normal mood and affect. Her behavior is normal.  Nursing note and vitals reviewed.    Musculoskeletal Exam: C-spine limited ROM especially with lateral rotation.  She has discomfort and reproducible pain with flexion of the c-spine. Thoracic and lumbar spine good ROM.  No midline spinal tenderness.  No SI joint tenderness.  Shoulder joints full ROM with mild discomfort of the left shoulder.  She has trapezius muscle tension and tenderness bilaterally.  Elbow joints, wrist joints, MCPs, PIPs, and DIPs good ROM with no synovitis.  PIP and DIP synovial thickening  consistent with osteoarthritis.     CDAI Exam: No CDAI exam completed.    Investigation: No additional findings. CBC Latest Ref Rng & Units 07/20/2017 09/27/2016 03/27/2016  WBC 4.0 - 10.5 K/uL 10.5 7.2 8.1  Hemoglobin 12.0 - 15.0 g/dL 16.112.7 09.611.8 04.512.3  Hematocrit 36.0 - 46.0 % 40.1 36.4 38.2  Platelets 150 - 400 K/uL 287 304 295   CMP Latest Ref Rng & Units 07/20/2017 09/27/2016 03/27/2016  Glucose 65 - 99 mg/dL 92 409(W105(H) 119(J120(H)  BUN 6 - 20 mg/dL 16 12 15   Creatinine 0.44 - 1.00 mg/dL 4.78(G1.01(H) 9.560.82 2.130.77  Sodium 135 - 145 mmol/L 141 139 139  Potassium 3.5 - 5.1 mmol/L 4.4 5.1 4.1  Chloride 101 - 111 mmol/L 105 103 103  CO2 22 - 32 mmol/L 23 25 29   Calcium 8.9 - 10.3 mg/dL 9.3 9.3 9.5  Total Protein 6.1 - 8.1 g/dL - 6.6 6.7  Total Bilirubin 0.2 - 1.2 mg/dL - 0.2 0.3  Alkaline Phos 33 - 130 U/L - 62 58  AST 10 - 35 U/L - 25 25  ALT 6 -  29 U/L - 19 20     Imaging: Xr Cervical Spine 2 Or 3 Views  Result Date: 10/10/2017 Multilevel spondylosis was noted.  C4-5, C5-6 severe narrowing was noted.  Anterior osteophytes were noted.  Facet joint arthropathy was noted. Impression: Multilevel spondylosis with severe narrowing between C4-5 and C5-6 noted.   Speciality Comments: Narcotic Agreement 02/15/17    Procedures:  Trigger Point Inj Date/Time: 10/10/2017 11:39 AM Performed by: Gearldine Bienenstock, PA-C Authorized by: Gearldine Bienenstock, PA-C   Consent Given by:  Patient Site marked: the procedure site was marked   Timeout: prior to procedure the correct patient, procedure, and site was verified   Indications:  Pain Total # of Trigger Points:  1 Location: neck   Needle Size:  27 G Approach:  Dorsal (left trapezius trigger point injection) Medications #1:  0.5 mL lidocaine 1 %; 10 mg triamcinolone acetonide 40 MG/ML Patient tolerance:  Patient tolerated the procedure well with no immediate complications   Allergies: Patient has no known allergies.   Assessment / Plan:     Visit  Diagnoses: Myofascial pain syndrome -She continues to have generalized muscle aches and muscle tenderness.  She is especially tender in the left trapezius muscle. She requested a left trapezius trigger point injection.  She tolerated the procedure well.  She continues to have bilateral trochanteric bursitis.  She is been having increased neck pain and stiffness since the beginning of March.  We obtained an x-ray of her C-spine today.  She was given a handout of neck exercises that she can perform at home.  She declined physical therapy at this time.  We will schedule her for an MRI of her C-spine.  She is been having to take extra Tylenol as well as her tramadol 500 mg 2 tablets daily for pain relief.  She has not been exercising as frequently due to her PCP recommending that she take it easy after her concussion on May 29.  We discussed the importance of good sleep hygiene.  She continues to take Lunesta at bedtime.  She states she continues to wake up feeling unrested and having worsening fatigue.    Other fatigue: Chronic and related to insomnia.  She has been staying active and walking on a regular basis.  She has been unable to do her normal exercise regimen due to having a concussion on March 29.  Other insomnia: She has been taking Lunesta at bedtime.  She continues to wake up feeling unrested and having worsening fatigue. Good sleep hygiene was discussed.   Primary osteoarthritis of both hands: She has PIP and DIP synovial thickening consistent with osteoarthritis of bilateral hands.  She has synovial thickening of bilateral CMC joints.  She has not been having increased hand pain or swelling.  Joint protection and muscle strengthening were discussed.  DDD (degenerative disc disease), cervical -she has been having increased neck pain and stiffness since the beginning of March.  She is having left-sided radiculopathy symptoms.  She has limited range of motion on exam especially with lateral rotation  to the left.  She has reproducible symptoms with flexion of the C-spine.  An x-ray of her C-spine was obtained today which revealed multilevel spondylosis.  She has displaced her rheumatoid C4- C5 and C5-C6.  We will move forward with scheduling an MRI of her C-spine.  She is to call us to tell us where she would like to schedule depending on her insurance.  She declined physical therapy at this time.  She is given a handout of neck exercises that she can perform at home.  A prednisone taper also be sent to the pharmacy.  Side effects were discussed.  Plan: XR Cervical Spine 2 or 3 views  Neck pain - She has been having increased pain in neck since the beginning of March 2019.  She sustained a concussion on July 20, 2017, which has worsened her neck pain.  X-rays of her C-spine were obtained today which revealed severe to space narrowing between C4-C5 and C5-C6.  We will move forward scheduling an MRI of her C-spine.  She was given a short prednisone taper today.  Plan: XR Cervical Spine 2 or 3 views   DDD (degenerative disc disease), thoracic: She has good ROM with no midline spinal tenderness.   DDD (degenerative disc disease), lumbar: She has midline spinal tenderness in the lumbar region.  She is having increased pain in her lower back.  Discussed the importance of strengthening her core muscles.  Lateral epicondylitis, right elbow: She continues to be tender in the right epicondylar region.  She performs exercises on a regular basis.  Lateral epicondylitis, left elbow: She continues to be tender in the left epicondylar region.  She performs her exercises on a regular basis.  Other medical conditions are listed as follows:   Hypermobility of joint  Positive PPD treated with INH   Rheumatoid factor positive -CCP   Osteopenia of multiple sites    Orders: Orders Placed This Encounter  Procedures  . Trigger Point Inj  . XR Cervical Spine 2 or 3 views   Meds ordered this encounter   Medications  . predniSONE (DELTASONE) 5 MG tablet    Sig: Take 4 tablets by mouth for 2 days with breakfast, 3 tablets for 2 days, 2 tablets for 2 days, 1 tablet for 2 days.    Dispense:  20 tablet    Refill:  0    Face-to-face time spent with patient was 30 minutes. >50% of time was spent in counseling and coordination of care.  Follow-Up Instructions: Return in about 6 months (around 04/11/2018) for Myofascial pain syndrome, Osteoarthritis.   Sherron Ales PA-C  I examined and evaluated the patient with Sherron Ales PA. The plan of care was discussed as noted above.  Pollyann Savoy, MD     Note - This record has been created using Animal nutritionist.  Chart creation errors have been sought, but may not always  have been located. Such creation errors do not reflect on  the standard of medical care.

## 2017-10-10 ENCOUNTER — Ambulatory Visit (INDEPENDENT_AMBULATORY_CARE_PROVIDER_SITE_OTHER): Payer: Self-pay

## 2017-10-10 ENCOUNTER — Encounter: Payer: Self-pay | Admitting: Physician Assistant

## 2017-10-10 ENCOUNTER — Ambulatory Visit: Payer: BLUE CROSS/BLUE SHIELD | Admitting: Physician Assistant

## 2017-10-10 VITALS — BP 118/60 | HR 85 | Resp 14 | Ht 58.5 in | Wt 95.0 lb

## 2017-10-10 DIAGNOSIS — M5134 Other intervertebral disc degeneration, thoracic region: Secondary | ICD-10-CM

## 2017-10-10 DIAGNOSIS — M542 Cervicalgia: Secondary | ICD-10-CM

## 2017-10-10 DIAGNOSIS — M5030 Other cervical disc degeneration, unspecified cervical region: Secondary | ICD-10-CM | POA: Diagnosis not present

## 2017-10-10 DIAGNOSIS — M8589 Other specified disorders of bone density and structure, multiple sites: Secondary | ICD-10-CM | POA: Diagnosis not present

## 2017-10-10 DIAGNOSIS — R7611 Nonspecific reaction to tuberculin skin test without active tuberculosis: Secondary | ICD-10-CM | POA: Diagnosis not present

## 2017-10-10 DIAGNOSIS — M19042 Primary osteoarthritis, left hand: Secondary | ICD-10-CM

## 2017-10-10 DIAGNOSIS — M249 Joint derangement, unspecified: Secondary | ICD-10-CM | POA: Diagnosis not present

## 2017-10-10 DIAGNOSIS — R5383 Other fatigue: Secondary | ICD-10-CM | POA: Diagnosis not present

## 2017-10-10 DIAGNOSIS — G4709 Other insomnia: Secondary | ICD-10-CM | POA: Diagnosis not present

## 2017-10-10 DIAGNOSIS — M7918 Myalgia, other site: Secondary | ICD-10-CM

## 2017-10-10 DIAGNOSIS — M19041 Primary osteoarthritis, right hand: Secondary | ICD-10-CM | POA: Diagnosis not present

## 2017-10-10 DIAGNOSIS — M62838 Other muscle spasm: Secondary | ICD-10-CM

## 2017-10-10 DIAGNOSIS — R768 Other specified abnormal immunological findings in serum: Secondary | ICD-10-CM | POA: Diagnosis not present

## 2017-10-10 DIAGNOSIS — M7711 Lateral epicondylitis, right elbow: Secondary | ICD-10-CM | POA: Diagnosis not present

## 2017-10-10 DIAGNOSIS — M7712 Lateral epicondylitis, left elbow: Secondary | ICD-10-CM

## 2017-10-10 DIAGNOSIS — M5136 Other intervertebral disc degeneration, lumbar region: Secondary | ICD-10-CM

## 2017-10-10 MED ORDER — PREDNISONE 5 MG PO TABS: ORAL_TABLET | ORAL | 0 refills | Status: DC

## 2017-10-10 MED ORDER — LIDOCAINE HCL 1 % IJ SOLN
0.5000 mL | INTRAMUSCULAR | Status: AC | PRN
  Administered 2017-10-10: .5 mL

## 2017-10-10 MED ORDER — TRIAMCINOLONE ACETONIDE 40 MG/ML IJ SUSP
10.0000 mg | INTRAMUSCULAR | Status: AC | PRN
  Administered 2017-10-10: 10 mg via INTRAMUSCULAR

## 2017-10-10 NOTE — Patient Instructions (Signed)
Neck Exercises Neck exercises can be important for many reasons:  They can help you to improve and maintain flexibility in your neck. This can be especially important as you age.  They can help to make your neck stronger. This can make movement easier.  They can reduce or prevent neck pain.  They may help your upper back.  Ask your health care provider which neck exercises would be best for you. Exercises Neck Press Repeat this exercise 10 times. Do it first thing in the morning and right before bed or as told by your health care provider. 1. Lie on your back on a firm bed or on the floor with a pillow under your head. 2. Use your neck muscles to push your head down on the pillow and straighten your spine. 3. Hold the position as well as you can. Keep your head facing up and your chin tucked. 4. Slowly count to 5 while holding this position. 5. Relax for a few seconds. Then repeat.  Isometric Strengthening Do a full set of these exercises 2 times a day or as told by your health care provider. 1. Sit in a supportive chair and place your hand on your forehead. 2. Push forward with your head and neck while pushing back with your hand. Hold for 10 seconds. 3. Relax. Then repeat the exercise 3 times. 4. Next, do thesequence again, this time putting your hand against the back of your head. Use your head and neck to push backward against the hand pressure. 5. Finally, do the same exercise on either side of your head, pushing sideways against the pressure of your hand.  Prone Head Lifts Repeat this exercise 5 times. Do this 2 times a day or as told by your health care provider. 1. Lie face-down, resting on your elbows so that your chest and upper back are raised. 2. Start with your head facing downward, near your chest. Position your chin either on or near your chest. 3. Slowly lift your head upward. Lift until you are looking straight ahead. Then continue lifting your head as far back as  you can stretch. 4. Hold your head up for 5 seconds. Then slowly lower it to your starting position.  Supine Head Lifts Repeat this exercise 8-10 times. Do this 2 times a day or as told by your health care provider. 1. Lie on your back, bending your knees to point to the ceiling and keeping your feet flat on the floor. 2. Lift your head slowly off the floor, raising your chin toward your chest. 3. Hold for 5 seconds. 4. Relax and repeat.  Scapular Retraction Repeat this exercise 5 times. Do this 2 times a day or as told by your health care provider. 1. Stand with your arms at your sides. Look straight ahead. 2. Slowly pull both shoulders backward and downward until you feel a stretch between your shoulder blades in your upper back. 3. Hold for 10-30 seconds. 4. Relax and repeat.  Contact a health care provider if:  Your neck pain or discomfort gets much worse when you do an exercise.  Your neck pain or discomfort does not improve within 2 hours after you exercise. If you have any of these problems, stop exercising right away. Do not do the exercises again unless your health care provider says that you can. Get help right away if:  You develop sudden, severe neck pain. If this happens, stop exercising right away. Do not do the exercises again unless your   health care provider says that you can. Exercises Neck Stretch  Repeat this exercise 3-5 times. 1. Do this exercise while standing or while sitting in a chair. 2. Place your feet flat on the floor, shoulder-width apart. 3. Slowly turn your head to the right. Turn it all the way to the right so you can look over your right shoulder. Do not tilt or tip your head. 4. Hold this position for 10-30 seconds. 5. Slowly turn your head to the left, to look over your left shoulder. 6. Hold this position for 10-30 seconds.  Neck Retraction Repeat this exercise 8-10 times. Do this 3-4 times a day or as told by your health care  provider. 1. Do this exercise while standing or while sitting in a sturdy chair. 2. Look straight ahead. Do not bend your neck. 3. Use your fingers to push your chin backward. Do not bend your neck for this movement. Continue to face straight ahead. If you are doing the exercise properly, you will feel a slight sensation in your throat and a stretch at the back of your neck. 4. Hold the stretch for 1-2 seconds. Relax and repeat.  This information is not intended to replace advice given to you by your health care provider. Make sure you discuss any questions you have with your health care provider. Document Released: 03/22/2015 Document Revised: 09/16/2015 Document Reviewed: 10/19/2014 Elsevier Interactive Patient Education  2018 Elsevier Inc.  

## 2017-10-26 ENCOUNTER — Other Ambulatory Visit: Payer: Self-pay | Admitting: Rheumatology

## 2017-10-26 NOTE — Telephone Encounter (Signed)
Last Visit: 10/10/17 Next Visit: 04/09/18 UDS: 02/15/17 Narc Agreement: 02/15/17  Okay to refill Tramadol?   Patient advised she is due to update UDS and will come to office 10/26/17 or 10/29/17 to update.

## 2017-10-26 NOTE — Telephone Encounter (Signed)
Per Taylor Dale, PA-C okay to call prescription to the pharmacy 

## 2017-10-29 ENCOUNTER — Other Ambulatory Visit: Payer: Self-pay

## 2017-10-29 DIAGNOSIS — Z5181 Encounter for therapeutic drug level monitoring: Secondary | ICD-10-CM

## 2017-10-31 LAB — PAIN MGMT, PROFILE 5 W/CONF, U
AMPHETAMINES: NEGATIVE ng/mL (ref <500)
Barbiturates: NEGATIVE ng/mL (ref <300)
Benzodiazepines: NEGATIVE ng/mL (ref <100)
COCAINE METABOLITE: NEGATIVE ng/mL (ref <150)
CREATININE: 25.8 mg/dL
Marijuana Metabolite: NEGATIVE ng/mL (ref <20)
Methadone Metabolite: NEGATIVE ng/mL (ref <100)
OPIATES: NEGATIVE ng/mL (ref <100)
OXYCODONE: NEGATIVE ng/mL (ref <100)
Oxidant: NEGATIVE ug/mL (ref <200)
pH: 5.87 (ref 4.5–9.0)

## 2017-10-31 LAB — PAIN MGMT, TRAMADOL W/MEDMATCH, U
Desmethyltramadol: 1463 ng/mL — ABNORMAL HIGH (ref <100)
TRAMADOL: 9508 ng/mL — AB (ref <100)

## 2017-11-12 ENCOUNTER — Ambulatory Visit: Payer: BLUE CROSS/BLUE SHIELD | Admitting: Rheumatology

## 2017-11-13 ENCOUNTER — Telehealth: Payer: Self-pay | Admitting: Rheumatology

## 2017-11-13 NOTE — Telephone Encounter (Signed)
Patient called stating she would like to wait to schedule her MRI until 12/23/17 when she goes on Medicare.  Patient is requesting a return call to let her know that waiting until 12/23/17 is okay and will not cause more damage.

## 2017-11-14 NOTE — Telephone Encounter (Signed)
Patient advised.

## 2017-11-14 NOTE — Telephone Encounter (Signed)
Patient called stating she would like to wait to schedule her MRI until 12/23/17 when she goes on Medicare.  Patient is requesting a return call to let her know that waiting until 12/23/17 is okay and will not cause more damage.    Per office visit on 10/10/17 DDD (degenerative disc disease), cervical -she has been having increased neck pain and stiffness since the beginning of March.  She is having left-sided radiculopathy symptoms.  She has limited range of motion on exam especially with lateral rotation to the left.  She has reproducible symptoms with flexion of the C-spine.  An x-ray of her C-spine was obtained today which revealed multilevel spondylosis.  She has displaced her rheumatoid C4- C5 and C5-C6.    Please advise.

## 2017-11-14 NOTE — Telephone Encounter (Signed)
Should be okay for her to wait until September unless her symptoms get worse.

## 2017-11-26 ENCOUNTER — Other Ambulatory Visit: Payer: Self-pay | Admitting: Physician Assistant

## 2017-11-26 NOTE — Telephone Encounter (Signed)
Last visit: 10/10/2017 Next visit: 04/09/2018 UDS: 10/29/2017 c/w Narc agreement: 02/15/2017  Last fill: 10/26/2017  Okay to refill tramadol?

## 2017-11-26 NOTE — Telephone Encounter (Signed)
ok 

## 2017-12-19 ENCOUNTER — Other Ambulatory Visit: Payer: Self-pay | Admitting: Rheumatology

## 2017-12-19 ENCOUNTER — Other Ambulatory Visit: Payer: Self-pay

## 2017-12-19 DIAGNOSIS — Z79899 Other long term (current) drug therapy: Secondary | ICD-10-CM

## 2017-12-19 LAB — COMPLETE METABOLIC PANEL WITH GFR
AG Ratio: 1.7 (calc) (ref 1.0–2.5)
ALT: 21 U/L (ref 6–29)
AST: 27 U/L (ref 10–35)
Albumin: 4.3 g/dL (ref 3.6–5.1)
Alkaline phosphatase (APISO): 49 U/L (ref 33–130)
BUN: 14 mg/dL (ref 7–25)
CO2: 31 mmol/L (ref 20–32)
Calcium: 9.9 mg/dL (ref 8.6–10.4)
Chloride: 102 mmol/L (ref 98–110)
Creat: 0.9 mg/dL (ref 0.50–0.99)
GFR, EST AFRICAN AMERICAN: 78 mL/min/{1.73_m2} (ref >=60)
GFR, EST NON AFRICAN AMERICAN: 68 mL/min/{1.73_m2} (ref >=60)
GLUCOSE: 99 mg/dL (ref 65–99)
Globulin: 2.6 g/dL (calc) (ref 1.9–3.7)
Potassium: 4.7 mmol/L (ref 3.5–5.3)
Sodium: 140 mmol/L (ref 135–146)
TOTAL PROTEIN: 6.9 g/dL (ref 6.1–8.1)
Total Bilirubin: 0.2 mg/dL (ref 0.2–1.2)

## 2017-12-19 LAB — CBC WITH DIFFERENTIAL/PLATELET
BASOS ABS: 68 {cells}/uL (ref 0–200)
Basophils Relative: 0.9 %
EOS ABS: 84 {cells}/uL (ref 15–500)
Eosinophils Relative: 1.1 %
HCT: 36.4 % (ref 35.0–45.0)
HEMOGLOBIN: 12.1 g/dL (ref 11.7–15.5)
LYMPHS ABS: 2645 {cells}/uL (ref 850–3900)
MCH: 30.1 pg (ref 27.0–33.0)
MCHC: 33.2 g/dL (ref 32.0–36.0)
MCV: 90.5 fL (ref 80.0–100.0)
MPV: 10.2 fL (ref 7.5–12.5)
Monocytes Relative: 6.9 %
NEUTROS ABS: 4279 {cells}/uL (ref 1500–7800)
Neutrophils Relative %: 56.3 %
Platelets: 298 10*3/uL (ref 140–400)
RBC: 4.02 10*6/uL (ref 3.80–5.10)
RDW: 12.9 % (ref 11.0–15.0)
Total Lymphocyte: 34.8 %
WBC mixed population: 524 cells/uL (ref 200–950)
WBC: 7.6 10*3/uL (ref 3.8–10.8)

## 2017-12-19 NOTE — Telephone Encounter (Signed)
Okay to give 30-day supply.  She needs labs to check her LFTs and GFR.  Please have patient come in for CBC and CMP.

## 2017-12-19 NOTE — Telephone Encounter (Signed)
Last visit: 10/10/2017 Next visit: 04/09/2018 Labs: 07/20/17 Creat. 1.01 GFR 58  Okay to refill Meloxicam?

## 2017-12-24 ENCOUNTER — Other Ambulatory Visit: Payer: Self-pay | Admitting: Physician Assistant

## 2017-12-25 ENCOUNTER — Other Ambulatory Visit: Payer: Self-pay | Admitting: Rheumatology

## 2017-12-25 NOTE — Telephone Encounter (Signed)
Last visit: 10/10/2017 Next visit: 04/09/2018 UDS: 10/29/2017 c/w Narc agreement: 02/15/2017  Last fill: 11/26/2017  Okay to refill tramadol?

## 2017-12-25 NOTE — Telephone Encounter (Signed)
Last visit: 10/10/2017 Next visit: 04/09/2018  Okay to refill Lunesta?

## 2018-01-22 DIAGNOSIS — H524 Presbyopia: Secondary | ICD-10-CM | POA: Diagnosis not present

## 2018-01-23 DIAGNOSIS — Z01419 Encounter for gynecological examination (general) (routine) without abnormal findings: Secondary | ICD-10-CM | POA: Diagnosis not present

## 2018-01-23 DIAGNOSIS — Z1231 Encounter for screening mammogram for malignant neoplasm of breast: Secondary | ICD-10-CM | POA: Diagnosis not present

## 2018-01-23 DIAGNOSIS — Z681 Body mass index (BMI) 19 or less, adult: Secondary | ICD-10-CM | POA: Diagnosis not present

## 2018-01-24 ENCOUNTER — Other Ambulatory Visit: Payer: Self-pay | Admitting: Physician Assistant

## 2018-01-24 NOTE — Telephone Encounter (Signed)
Last visit: 10/10/2017 Next visit: 04/09/2018  Okay to refill Lunesta?  

## 2018-01-29 ENCOUNTER — Other Ambulatory Visit: Payer: Self-pay | Admitting: Physician Assistant

## 2018-01-29 NOTE — Telephone Encounter (Signed)
Patient advised she is due to update narcotic agreement. Patient will update today.

## 2018-01-29 NOTE — Telephone Encounter (Signed)
Please advise patient to come by the office to update narcotic agreement.

## 2018-01-29 NOTE — Telephone Encounter (Signed)
Last visit: 10/10/2017 Next visit: 04/09/2018 UDS: 10/29/2017 c/w Narc agreement: 02/15/2017  Last fill: 12/25/2017  Okay to refill tramadol?

## 2018-02-12 ENCOUNTER — Telehealth: Payer: Self-pay | Admitting: Rheumatology

## 2018-02-12 NOTE — Telephone Encounter (Signed)
Patient called stating she received a letter from her insurance company [employer] regarding her prescription of Eszopiclone 3 mg tablets.  Patient states the letter states "that the medication is not on their formulary drug list and will require prior authorization to show that patient meets the criteria."  Patient states please contact [employer] [facility].  Phone #[phone] and Fax #[phone]

## 2018-02-15 DIAGNOSIS — Z23 Encounter for immunization: Secondary | ICD-10-CM | POA: Diagnosis not present

## 2018-02-15 DIAGNOSIS — J309 Allergic rhinitis, unspecified: Secondary | ICD-10-CM | POA: Diagnosis not present

## 2018-02-15 DIAGNOSIS — Z0000 Encounter for general adult medical examination without abnormal findings: Secondary | ICD-10-CM | POA: Diagnosis not present

## 2018-02-15 DIAGNOSIS — J9801 Acute bronchospasm: Secondary | ICD-10-CM | POA: Diagnosis not present

## 2018-02-15 DIAGNOSIS — M797 Fibromyalgia: Secondary | ICD-10-CM | POA: Diagnosis not present

## 2018-02-15 DIAGNOSIS — M1990 Unspecified osteoarthritis, unspecified site: Secondary | ICD-10-CM | POA: Diagnosis not present

## 2018-02-15 DIAGNOSIS — K219 Gastro-esophageal reflux disease without esophagitis: Secondary | ICD-10-CM | POA: Diagnosis not present

## 2018-02-19 ENCOUNTER — Encounter (INDEPENDENT_AMBULATORY_CARE_PROVIDER_SITE_OTHER): Payer: Self-pay

## 2018-02-19 ENCOUNTER — Telehealth: Payer: Self-pay | Admitting: Rheumatology

## 2018-02-19 DIAGNOSIS — Z136 Encounter for screening for cardiovascular disorders: Secondary | ICD-10-CM | POA: Diagnosis not present

## 2018-02-19 DIAGNOSIS — K219 Gastro-esophageal reflux disease without esophagitis: Secondary | ICD-10-CM | POA: Diagnosis not present

## 2018-02-19 DIAGNOSIS — Z0000 Encounter for general adult medical examination without abnormal findings: Secondary | ICD-10-CM | POA: Diagnosis not present

## 2018-02-19 DIAGNOSIS — Z131 Encounter for screening for diabetes mellitus: Secondary | ICD-10-CM | POA: Diagnosis not present

## 2018-02-19 NOTE — Telephone Encounter (Signed)
Error

## 2018-02-19 NOTE — Telephone Encounter (Signed)
Patient requested a prescription refill of Meloxicam to be sent to [facility] at [address].  Patient states

## 2018-02-20 ENCOUNTER — Other Ambulatory Visit: Payer: Self-pay | Admitting: Rheumatology

## 2018-02-20 MED ORDER — MELOXICAM 15 MG PO TABS: 15.0000 mg | ORAL_TABLET | Freq: Every day | ORAL | 0 refills | Status: DC

## 2018-02-20 NOTE — Telephone Encounter (Signed)
Last visit: 10/10/2017 Next visit: 04/09/2018 Labs: 12/19/17 WNL  Okay to refill per Dr. Corliss Skains

## 2018-02-20 NOTE — Telephone Encounter (Signed)
Patient advised the prior authorization has been completed via phone and an answer will be faxed in 24-72 hours. Reference number [id number]. Patient states she needs a refill on her Mobic. Patient states she would like the prescription to go to her local pharmacy.   Last Visit: 10/10/17 Next Visit: 04/09/18 Labs: 12/19/17 WNL  Okay to refill per Dr. Corliss Skains

## 2018-02-25 MED ORDER — SUVOREXANT 10 MG PO TABS: 10.0000 mg | ORAL_TABLET | Freq: Every day | ORAL | 0 refills | Status: DC

## 2018-02-25 NOTE — Telephone Encounter (Addendum)
Prior Authorization was denied. Per Dr. Corliss Skains on 02/22/18 okay to give prescription for Belsomra as it is preferred. Patient is willing to try prescription. She will call back if not working and then we can appeal decision.

## 2018-02-25 NOTE — Addendum Note (Signed)
Addended by: Henriette Combs on: 02/25/2018 03:11 PM   Modules accepted: Orders

## 2018-02-28 ENCOUNTER — Telehealth: Payer: Self-pay | Admitting: *Deleted

## 2018-02-28 NOTE — Telephone Encounter (Signed)
Patient states she has tried the [employer] and states that it is not working. Patient states she did not sleep at all the first night and only got 1 hour of sleep last night. Patient states her shoulder and neck are hurting from not getting any sleep. Patient states she has also gotten a headache. Patient has contacted the insurance company and they advised her to have [place] resubmitted a PA for the [facility] and explain in detail why she needs the medication.   Per patient: It work very well for patient and has for years. She has no side effects from this medication., no drowsiness the next day and able to get good sleep in order to function for activities of daily living. Helps relax patient and her muscles since she suffers from Fibromyalgia so she can get quality sleep.

## 2018-03-01 ENCOUNTER — Other Ambulatory Visit: Payer: Self-pay | Admitting: Physician Assistant

## 2018-03-01 NOTE — Telephone Encounter (Signed)
Last visit: 10/10/2017 Next visit: 04/09/2018 UDS: 10/29/2017 c/w Narc agreement: 02/20/18  Okay to refill tramadol? 

## 2018-03-08 ENCOUNTER — Other Ambulatory Visit: Payer: Self-pay | Admitting: *Deleted

## 2018-03-08 NOTE — Telephone Encounter (Signed)
Patient advised about appeal approval.   Last visit: 10/10/2017 Next visit: 04/09/2018  Okay to refill Eszopiclone?

## 2018-03-08 NOTE — Telephone Encounter (Signed)
[facility] [employer] [facility] Eszopiclone 3 mg. Approved from 03/08/18-04/24/19.

## 2018-03-11 MED ORDER — ESZOPICLONE 3 MG PO TABS: ORAL_TABLET | ORAL | 0 refills | Status: DC

## 2018-03-11 NOTE — Telephone Encounter (Signed)
ok 

## 2018-03-26 NOTE — Progress Notes (Signed)
Office Visit Note  Patient: Victoria Watson             Date of Birth: 1950-10-02           MRN: [id number]             PCP: Maurice Small, MD Referring: Maurice Small, MD Visit Date: 04/09/2018 Occupation: @GUAROCC @  Subjective:  Neck pain   History of Present Illness: Victoria Watson is a 67 y.o. female with history of myofascial pain syndrome, osteoarthritis, and DDD.  She is taking Tramadol 50 mg 1 tablet BID PRN and Mobic 7.5 mg po prn for pain relief.  She has been taking tylenol for breakthrough pain. She is no longer taking Belsomra for insomnia due to it being ineffective.  She is taking Lunesta 3 mg at bedtime. She needs a refill today. She feels like her fatigue has been worsening. She has intermittent lower back pain.  She continues to have chronic neck pain and stiffness.  She has intermittent left sided radiculopathy.  She has been performed neck exercises on a daily basis, which have been improving her symptoms.  She has trapezius muscle spasms and tenderness.  She would like to move forward with a MRI of the C-spine.  She has hand stiffness bilaterally, but she denies any joint pain or joint swelling.   Activities of Daily Living:  Patient reports morning stiffness for 1-3  hours.   Patient Reports nocturnal pain.  Difficulty dressing/grooming: Denies Difficulty climbing stairs: Denies Difficulty getting out of chair: Denies Difficulty using hands for taps, buttons, cutlery, and/or writing: Denies  Review of Systems  Constitutional: Positive for fatigue.  HENT: Negative for mouth sores, mouth dryness and nose dryness.   Eyes: Positive for dryness (Restasis BID). Negative for pain and visual disturbance.  Respiratory: Negative for cough, hemoptysis, shortness of breath and difficulty breathing.   Cardiovascular: Negative for chest pain, palpitations, hypertension and swelling in legs/feet.  Gastrointestinal: Negative for blood in stool, constipation and  diarrhea.  Endocrine: Negative for increased urination.  Genitourinary: Negative for painful urination.  Musculoskeletal: Positive for myalgias, morning stiffness, muscle tenderness and myalgias. Negative for arthralgias, joint pain, joint swelling and muscle weakness.  Skin: Negative for color change, pallor, rash, hair loss, nodules/bumps, skin tightness, ulcers and sensitivity to sunlight.  Allergic/Immunologic: Negative for susceptible to infections.  Neurological: Positive for headaches. Negative for dizziness, numbness and weakness.  Hematological: Negative for swollen glands.  Psychiatric/Behavioral: Positive for sleep disturbance (Lunesta ). Negative for depressed mood. The patient is not nervous/anxious.     PMFS History:  Patient Active Problem List   Diagnosis Date Noted  . Hypermobility of joint 11/01/2016  . DDD (degenerative disc disease), thoracic 11/01/2016  . Osteopenia of multiple sites 11/01/2016  . Positive PPD treated with INH  11/01/2016  . Rheumatoid factor positive -CCP  11/01/2016  . Myofacial muscle pain 03/28/2016  . Insomnia 03/28/2016  . Other fatigue 03/28/2016  . DDD (degenerative disc disease), lumbar 03/28/2016  . DDD (degenerative disc disease), cervical 03/28/2016  . Primary osteoarthritis of both hands 03/28/2016  . Bronchiectasis (HCC) 07/09/2015  . Wheezing 06/16/2015  . Chest crackles 06/16/2015    Past Medical History:  Diagnosis Date  . Fibromyalgia   . Insomnia   . OA (osteoarthritis)   . Positive PPD    Took INH tabs x 3 months    Family History  Problem Relation Age of Onset  . Heart disease Father   . COPD Father   .  Heart disease Paternal Grandmother   . Heart disease Paternal Grandfather    Past Surgical History:  Procedure Laterality Date  . TONSILLECTOMY    . tooth implant Left   . TOTAL ABDOMINAL HYSTERECTOMY  1990   Social History   Social History Narrative  . Not on file    Objective: Vital Signs: BP 135/75  (BP Location: Left Arm, Patient Position: Sitting, Cuff Size: Small)   Pulse 84   Resp 12   Ht 4' 10.5" (1.486 m)   Wt 99 lb 9.6 oz (45.2 kg)   BMI 20.46 kg/m    Physical Exam Vitals signs and nursing note reviewed.  Constitutional:      Appearance: She is well-developed.  HENT:     Head: Normocephalic and atraumatic.  Eyes:     Conjunctiva/sclera: Conjunctivae normal.  Neck:     Musculoskeletal: Normal range of motion.  Cardiovascular:     Rate and Rhythm: Normal rate and regular rhythm.     Heart sounds: Normal heart sounds.  Pulmonary:     Effort: Pulmonary effort is normal.     Breath sounds: Normal breath sounds.  Abdominal:     General: Bowel sounds are normal.     Palpations: Abdomen is soft.  Lymphadenopathy:     Cervical: No cervical adenopathy.  Skin:    General: Skin is warm and dry.     Capillary Refill: Capillary refill takes less than 2 seconds.  Neurological:     Mental Status: She is alert and oriented to person, place, and time.  Psychiatric:        Behavior: Behavior normal.      Musculoskeletal Exam: C-spine limited lateral rotation.  Thoracic and lumbar good ROM.  Mild midline spinal tenderness in the lumbar region.  No SI joint tenderness. Trapezius muscle tension bilaterally. Shoulder joints, elbow joints, MCPs, PIPs, and DIPs good ROM with no synovitis.  Complete fist formation.  Hip joints, knee joints, ankle joints, MTPs, PIPs, and DIPs good ROM with no synovitis.  No warmth or effusion of knee joints.  No tenderness or swelling of ankle joints.  No tenderness of trochanteric bursa bilaterally.   CDAI Exam: CDAI Score: Not documented Patient Global Assessment: Not documented; Provider Global Assessment: Not documented Swollen: Not documented; Tender: Not documented Joint Exam   Not documented   There is currently no information documented on the homunculus. Go to the Rheumatology activity and complete the homunculus joint  exam.  Investigation: No additional findings.  Imaging: No results found.  Recent Labs: Lab Results  Component Value Date   WBC 7.6 12/19/2017   HGB 12.1 12/19/2017   PLT 298 12/19/2017   NA 140 12/19/2017   K 4.7 12/19/2017   CL 102 12/19/2017   CO2 31 12/19/2017   GLUCOSE 99 12/19/2017   BUN 14 12/19/2017   CREATININE 0.90 12/19/2017   BILITOT 0.2 12/19/2017   ALKPHOS 62 09/27/2016   AST 27 12/19/2017   ALT 21 12/19/2017   PROT 6.9 12/19/2017   ALBUMIN 4.0 09/27/2016   CALCIUM 9.9 12/19/2017   GFRAA 78 12/19/2017    Speciality Comments: Narcotic Agreement 02/15/17  Procedures:  No procedures performed Allergies: Patient has no known allergies.   Assessment / Plan:     Visit Diagnoses: Myofascial pain syndrome: She continues have generalized muscle aches and muscle tenderness.  She has trapezius muscle tension and muscle tenderness bilaterally.  She continues to take tramadol 50 mg 1 tablet twice daily PRN for pain  relief as well as Mobic 7.5 mg daily.  She takes Tylenol for breakthrough pain.  She continues to have chronic fatigue and insomnia.  She is been taking Lunesta 3 mg by mouth at bedtime to help with insomnia.  She is encouraged to stay active and exercise on a regular basis.  She will follow-up in the office in 6 months.  Other fatigue: Her fatigue has been worsening recently.  She feels the fatigue is related to insomnia.  Other insomnia: She is taking Lunesta 3 mg tablets by mouth at bedtime for insomnia.  A refill of Lunesta will be sent to the pharmacy.  Primary osteoarthritis of both hands: She has PIP and DIP synovial thickening consistent with osteoarthritis of bilateral hands.  She has no synovitis on exam.  She is complete fist formation bilaterally.  Joint protection and muscle strengthening were discussed.  DDD (degenerative disc disease), cervical: She has limited range of motion of the C-spine.  She is trapezius muscle tension and muscle  tenderness bilaterally.  She is intermittent symptoms of left-sided radiculopathy.  She had an x-ray of the C-spine obtained on 10/10/2017 that revealed multilevel spondylosis with severe narrowing between C4-C5 and C5-C6.  She would like to move forward with scheduling an MRI of the C-spine.  DDD (degenerative disc disease), thoracic: No midline spinal tenderness.  DDD (degenerative disc disease), lumbar: She is occasional lower back pain.  She states the pain is exacerbated when she is lifting her grandchildren.  She has no radiating pain or numbness.  Rheumatoid factor positive -CCP: She has no synovitis on exam.  She has PIP and DIP synovial thickening consistent with osteoarthritis.    Lateral epicondylitis, right elbow: Resolved   Lateral epicondylitis, left elbow: Resolved   Trapezius muscle spasm: She continues to have trapezius muscle tension and tenderness bilaterally.   Osteopenia of multiple sites: She takes a calcium and vitamin D supplement daily.   Medication monitoring encounter -UDS and narcotic agreement were updated today 04/09/18. Plan: Pain Mgmt, Profile 5 w/Conf, U, Pain Mgmt, Tramadol w/medMATCH, U   Hypermobility of joint  Positive PPD treated with INH    Orders: Orders Placed This Encounter  Procedures  . Pain Mgmt, Profile 5 w/Conf, U  . Pain Mgmt, Tramadol w/medMATCH, U   Meds ordered this encounter  Medications  . Eszopiclone 3 MG TABS    Sig: TAKE 1 TABLET BY MOUTH IMMEDIATELY BEFORE BEDTIME AS NEEDED FOR INSOMNIA    Dispense:  30 tablet    Refill:  0    Face-to-face time spent with patient was 30 minutes. Greater than 50% of time was spent in counseling and coordination of care.  Follow-Up Instructions: Return in about 6 months (around 10/09/2018) for Myofascial pain syndrome, DDD, Osteoarthritis.   Gearldine Bienenstock, PA-C  Note - This record has been created using Dragon software.  Chart creation errors have been sought, but may not always  have  been located. Such creation errors do not reflect on  the standard of medical care.

## 2018-04-01 ENCOUNTER — Other Ambulatory Visit: Payer: Self-pay | Admitting: Physician Assistant

## 2018-04-01 NOTE — Telephone Encounter (Signed)
Last visit: 10/10/2017 Next visit: 04/09/2018 UDS: 10/29/2017 c/w Narc agreement: 02/20/18  Okay to refill tramadol?

## 2018-04-01 NOTE — Telephone Encounter (Signed)
Note added to next appointment.

## 2018-04-01 NOTE — Telephone Encounter (Signed)
She will need to update UDS at next office visit.

## 2018-04-05 ENCOUNTER — Other Ambulatory Visit: Payer: Self-pay | Admitting: Rheumatology

## 2018-04-09 ENCOUNTER — Encounter: Payer: Self-pay | Admitting: Physician Assistant

## 2018-04-09 ENCOUNTER — Ambulatory Visit (INDEPENDENT_AMBULATORY_CARE_PROVIDER_SITE_OTHER): Payer: Medicare HMO | Admitting: Physician Assistant

## 2018-04-09 VITALS — BP 135/75 | HR 84 | Resp 12 | Ht 58.5 in | Wt 99.6 lb

## 2018-04-09 DIAGNOSIS — R7611 Nonspecific reaction to tuberculin skin test without active tuberculosis: Secondary | ICD-10-CM

## 2018-04-09 DIAGNOSIS — Z5181 Encounter for therapeutic drug level monitoring: Secondary | ICD-10-CM

## 2018-04-09 DIAGNOSIS — M7918 Myalgia, other site: Secondary | ICD-10-CM | POA: Diagnosis not present

## 2018-04-09 DIAGNOSIS — M19041 Primary osteoarthritis, right hand: Secondary | ICD-10-CM | POA: Diagnosis not present

## 2018-04-09 DIAGNOSIS — M249 Joint derangement, unspecified: Secondary | ICD-10-CM | POA: Diagnosis not present

## 2018-04-09 DIAGNOSIS — G4709 Other insomnia: Secondary | ICD-10-CM | POA: Diagnosis not present

## 2018-04-09 DIAGNOSIS — M5030 Other cervical disc degeneration, unspecified cervical region: Secondary | ICD-10-CM

## 2018-04-09 DIAGNOSIS — R5383 Other fatigue: Secondary | ICD-10-CM

## 2018-04-09 DIAGNOSIS — M5136 Other intervertebral disc degeneration, lumbar region: Secondary | ICD-10-CM | POA: Diagnosis not present

## 2018-04-09 DIAGNOSIS — M5134 Other intervertebral disc degeneration, thoracic region: Secondary | ICD-10-CM | POA: Diagnosis not present

## 2018-04-09 DIAGNOSIS — M8589 Other specified disorders of bone density and structure, multiple sites: Secondary | ICD-10-CM

## 2018-04-09 DIAGNOSIS — R768 Other specified abnormal immunological findings in serum: Secondary | ICD-10-CM

## 2018-04-09 DIAGNOSIS — M62838 Other muscle spasm: Secondary | ICD-10-CM

## 2018-04-09 DIAGNOSIS — M7711 Lateral epicondylitis, right elbow: Secondary | ICD-10-CM

## 2018-04-09 DIAGNOSIS — M19042 Primary osteoarthritis, left hand: Secondary | ICD-10-CM

## 2018-04-09 DIAGNOSIS — M7712 Lateral epicondylitis, left elbow: Secondary | ICD-10-CM

## 2018-04-09 MED ORDER — ESZOPICLONE 3 MG PO TABS: ORAL_TABLET | ORAL | 0 refills | Status: DC

## 2018-04-09 NOTE — Addendum Note (Signed)
Addended by: Ellen HenriGRAVES, Lexani Corona C on: 04/09/2018 04:34 PM   Modules accepted: Orders

## 2018-04-11 LAB — PAIN MGMT, PROFILE 5 W/CONF, U
Amphetamines: NEGATIVE ng/mL (ref <500)
Barbiturates: NEGATIVE ng/mL (ref <300)
Benzodiazepines: NEGATIVE ng/mL (ref <100)
CREATININE: 15.6 mg/dL — AB
Cocaine Metabolite: NEGATIVE ng/mL (ref <150)
MARIJUANA METABOLITE: NEGATIVE ng/mL (ref <20)
Methadone Metabolite: NEGATIVE ng/mL (ref <100)
OXYCODONE: NEGATIVE ng/mL (ref <100)
Opiates: NEGATIVE ng/mL (ref <100)
Oxidant: NEGATIVE ug/mL (ref <200)
PH: 6.47 (ref 4.5–9.0)
SPECIFIC GRAVITY: 1.004 (ref >=1.0)

## 2018-04-11 LAB — PAIN MGMT, TRAMADOL W/MEDMATCH, U
Desmethyltramadol: 589 ng/mL — ABNORMAL HIGH (ref <100)
TRAMADOL: 4454 ng/mL — AB (ref <100)

## 2018-05-03 ENCOUNTER — Other Ambulatory Visit: Payer: Self-pay | Admitting: Physician Assistant

## 2018-05-03 NOTE — Telephone Encounter (Signed)
Last Visit: 04/09/18 Next Visit: 10/08/18 UDS: 04/09/18 Narc Agreement: 04/09/18  Okay to refill Tramadol? 

## 2018-05-08 ENCOUNTER — Other Ambulatory Visit: Payer: Self-pay | Admitting: Physician Assistant

## 2018-05-08 ENCOUNTER — Ambulatory Visit (HOSPITAL_COMMUNITY)
Admission: RE | Admit: 2018-05-08 | Discharge: 2018-05-08 | Disposition: A | Discharge status: Home / Self Care | Payer: Medicare HMO | Source: Ambulatory Visit | Attending: Physician Assistant | Admitting: Physician Assistant

## 2018-05-08 DIAGNOSIS — M5030 Other cervical disc degeneration, unspecified cervical region: Secondary | ICD-10-CM | POA: Diagnosis not present

## 2018-05-08 DIAGNOSIS — M542 Cervicalgia: Secondary | ICD-10-CM | POA: Diagnosis not present

## 2018-05-08 NOTE — Telephone Encounter (Signed)
ok 

## 2018-05-08 NOTE — Progress Notes (Signed)
Please notify patient that she has degenerative changes and spondylosis leading to impingement at multiple levels.  She may benefit from injections.  Please notify patient and refer her to Dr. Alvester Morin for further evaluation and treatment.

## 2018-05-08 NOTE — Telephone Encounter (Signed)
Last Visit: 04/09/18 Next Visit: 10/08/18

## 2018-05-09 ENCOUNTER — Telehealth: Payer: Self-pay | Admitting: *Deleted

## 2018-05-09 DIAGNOSIS — M542 Cervicalgia: Secondary | ICD-10-CM

## 2018-05-09 NOTE — Telephone Encounter (Signed)
-----   Message from Gearldine Bienenstock, PA-C sent at 05/08/2018 12:11 PM EST ----- Please notify patient that she has degenerative changes and spondylosis leading to impingement at multiple levels.  She may benefit from injections.  Please notify patient and refer her to Dr. Alvester Morin for further evaluation and treatment.

## 2018-05-28 ENCOUNTER — Ambulatory Visit (INDEPENDENT_AMBULATORY_CARE_PROVIDER_SITE_OTHER): Payer: Medicare HMO | Admitting: Physical Medicine and Rehabilitation

## 2018-05-28 ENCOUNTER — Encounter (INDEPENDENT_AMBULATORY_CARE_PROVIDER_SITE_OTHER): Payer: Self-pay | Admitting: Physical Medicine and Rehabilitation

## 2018-05-28 ENCOUNTER — Other Ambulatory Visit: Payer: Self-pay | Admitting: Rheumatology

## 2018-05-28 VITALS — BP 140/77 | HR 86

## 2018-05-28 DIAGNOSIS — M5030 Other cervical disc degeneration, unspecified cervical region: Secondary | ICD-10-CM | POA: Diagnosis not present

## 2018-05-28 DIAGNOSIS — M7918 Myalgia, other site: Secondary | ICD-10-CM

## 2018-05-28 DIAGNOSIS — M4802 Spinal stenosis, cervical region: Secondary | ICD-10-CM

## 2018-05-28 DIAGNOSIS — M4302 Spondylolysis, cervical region: Secondary | ICD-10-CM

## 2018-05-28 DIAGNOSIS — M791 Myalgia, unspecified site: Secondary | ICD-10-CM

## 2018-05-28 NOTE — Telephone Encounter (Signed)
Last Visit: 04/09/18 Next Visit: 10/08/18 Labs: 12/19/17 WNL  Okay to refill per Dr. Corliss Skainseveshwar

## 2018-05-28 NOTE — Progress Notes (Signed)
  Numeric Pain Rating Scale and Functional Assessment Average Pain 7 Pain Right Now 7 My pain is constant, dull and aching Pain is worse with: some activites Pain improves with: heat/ice, therapy/exercise and medication   In the last MONTH (on 0-10 scale) has pain interfered with the following?  1. General activity like being  able to carry out your everyday physical activities such as walking, climbing stairs, carrying groceries, or moving a chair?  Rating(1)  2. Relation with others like being able to carry out your usual social activities and roles such as  activities at home, at work and in your community. Rating(1)  3. Enjoyment of life such that you have  been bothered by emotional problems such as feeling anxious, depressed or irritable?  Rating(9)

## 2018-05-29 ENCOUNTER — Encounter (INDEPENDENT_AMBULATORY_CARE_PROVIDER_SITE_OTHER): Payer: Self-pay | Admitting: Physical Medicine and Rehabilitation

## 2018-05-29 NOTE — Progress Notes (Signed)
Victoria Richterslizabeth E Rockholt - 68 y.o. female MRN [id number]  Date of birth: 06/13/1950  Office Visit Note: Visit Date: 05/28/2018 PCP: Maurice SmallGriffin, Elaine, MD Referred by: Maurice SmallGriffin, Elaine, MD  Subjective: Chief Complaint  Patient presents with  . Neck - Pain  . Left Shoulder - Pain  . Right Shoulder - Pain   HPI: Victoria Watson is a 68 y.o. female who comes in today At the request of Dr. Melvenia NeedlesShali Deveshwar for evaluation management of chronic worsening severe neck pain with referral into the shoulder arms bilaterally left more than right.  Patient has a history of osteoarthritis some osteoporosis as well as fibromyalgia and myofascial pain syndrome being treated by Dr. Corliss Skainseveshwar and her assistant Sherron Alesale Taylor.  She reports worsening symptoms over the last 2 years without specific injury.  She reports significant pain in the trapezius area bilaterally with referral up into the neck and posterior part of the occiput as well as to the shoulders bilaterally.  She also gets a lot of referral pain to the shoulder blades bilaterally in the middle of the upper back.  She also is endorsing some lower back pain and really four-quadrant body pain.  She reports she had some numbness and tingling in the hands maybe 6 months ago but that is no longer present.  She has been treated with medication management and has had what appears to be trigger point injection or cortisone injection of the trapezius on a couple of occasions with some mild relief temporarily.  She has not had focused physical therapy or dry needling.  She has had some chiropractic work several years ago but not recently.  She uses tramadol and meloxicam as well as Voltaren gel.  MRI of the cervical spine was performed and this is reviewed with the patient today using spine models and imaging.  She has significant degenerative changes C4-5 with mild to moderate cervical stenosis at this level and a level below at C5-6.  She has significant facet arthropathy of  the upper cervical joints and then at the very bottom C7.  She has some foraminal narrowing but not greatly.  She really does not have radicular complaints down the arms although again some pain may be in a C5 distribution of the shoulder blades.  Review of Systems  Constitutional: Negative for chills, fever, malaise/fatigue and weight loss.  HENT: Negative for hearing loss and sinus pain.   Eyes: Negative for blurred vision, double vision and photophobia.  Respiratory: Negative for cough and shortness of breath.   Cardiovascular: Negative for chest pain, palpitations and leg swelling.  Gastrointestinal: Negative for abdominal pain, nausea and vomiting.  Genitourinary: Negative for flank pain.  Musculoskeletal: Positive for back pain, joint pain and neck pain. Negative for myalgias.  Skin: Negative for itching and rash.  Neurological: Positive for tingling. Negative for tremors, focal weakness and weakness.  Endo/Heme/Allergies: Negative.   Psychiatric/Behavioral: Negative for depression.  All other systems reviewed and are negative.  Otherwise per HPI.  Assessment & Plan: Visit Diagnoses:  1. Spondylolysis, cervical region   2. Trigger point   3. Myofascial pain syndrome   4. Spinal stenosis of cervical region   5. Other cervical disc degeneration, unspecified cervical region     Plan: Findings:  Chronic worsening severe at times, 7 out of 10 pain, located in the cervical spine and bilateral trapezius and bilateral scapula.  Clinical history and exam consistent with myofascial pain syndrome with pretty prominent active trigger points that do reproduce most  of her pain.  Trigger points in the levator scapula rhomboids teres minor and trapezius.  Sometimes we look at this as may be there are active because of the cervical spine issue and she does have problems at C4-5 and C5-6 without focal compression or compression of the cord but there is tightness in this area.  I think given the  history of prolonged myofascial pain syndrome and fibromyalgia the best course of action is a short course of physical therapy at [facility] physical therapy with dry needling to see if they can calm her symptoms down and then look at exercises to strengthen this area and try to limit flareups of pain along with continuing to follow with Dr. Corliss Skains.  If this does not help or become stagnant or worsens then I would look at a cervical epidural injection we discussed this at length.  Consideration could be given to facet joint blocks and radiofrequency denervation.  I feel like most of her pain though is the myofascial pain versus facet arthropathy.  There is nothing at least on images that is overtly surgical at this point.    Meds & Orders: No orders of the defined types were placed in this encounter.   Orders Placed This Encounter  Procedures  . Ambulatory referral to Physical Therapy    Follow-up: Return if symptoms worsen or fail to improve, for Consider cervical epidural injection with fluoroscopic guidance.   Procedures: No procedures performed  No notes on file   Clinical History: MRI CERVICAL SPINE WITHOUT CONTRAST  TECHNIQUE: Multiplanar, multisequence MR imaging of the cervical spine was performed. No intravenous contrast was administered.  COMPARISON:  Radiographs from 10/10/2017  FINDINGS: Alignment: 2 mm degenerative retrolisthesis at C4-5 and 1.5 mm degenerative retrolisthesis at C5-6. Mild reversal of the normal cervical lordosis in this vicinity.  Vertebrae: Type 3 degenerative endplate findings at C4-5.  Cord: No significant abnormal spinal cord signal is observed.  Posterior Fossa, vertebral arteries, paraspinal tissues: Unremarkable  Disc levels:  C2-3: Mild left foraminal stenosis due to facet arthropathy, vertebral artery loop formation in the left lateral margin of the neural foramina may also be contributory.  C3-4: Mild right foraminal  stenosis due to right greater than left facet spurring. Mild disc bulge, without impingement.  C4-5: Moderate to prominent left and mild right foraminal stenosis and borderline central narrowing of the thecal sac due to facet and uncinate spurring along with mild disc bulge.  C5-6: Moderate bilateral foraminal stenosis and borderline central narrowing of the thecal sac due to bilateral uncinate and facet spurring along with mild disc bulge.  C6-7: No impingement. Mild bilateral facet arthropathy and mild disc bulge.  C7-T1: No impingement. Bilateral facet arthropathy. Is potentially a small left synovial cyst on image 11/12.  T1-2: Borderline left foraminal stenosis due to facet arthropathy.  IMPRESSION: 1. Cervical spondylosis and degenerative disc disease, causing moderate to prominent impingement at C4-5; moderate impingement at C5-6; and mild impingement at C2-3 and C3-4, as detailed above.   Electronically Signed   By: Gaylyn Rong M.D.   On: 05/08/2018 10:31   She reports that she has never smoked. She has never used smokeless tobacco. No results for input(s): HGBA1C, LABURIC in the last 8760 hours.  Objective:  VS:  HT:    WT:   BMI:     BP:140/77  HR:86bpm  TEMP: ( )  RESP:  Physical Exam Vitals signs and nursing note reviewed.  Constitutional:      General: She  is not in acute distress.    Appearance: Normal appearance. She is well-developed.     Comments: Thin appearing  HENT:     Head: Normocephalic and atraumatic.     Nose: Nose normal.     Mouth/Throat:     Mouth: Mucous membranes are moist.     Pharynx: Oropharynx is clear.  Eyes:     Conjunctiva/sclera: Conjunctivae normal.     Pupils: Pupils are equal, round, and reactive to light.  Neck:     Musculoskeletal: Neck rigidity and muscular tenderness present.  Cardiovascular:     Rate and Rhythm: Regular rhythm.  Pulmonary:     Effort: Pulmonary effort is normal. No respiratory  distress.  Abdominal:     General: There is no distension.     Palpations: Abdomen is soft.     Tenderness: There is no guarding.  Musculoskeletal:     Right lower leg: No edema.     Left lower leg: No edema.     Comments: Patient sits with forward flexed cervical spine she does have pain at end ranges of rotation left and right and more pain with flexion than extension although she does get some pain with extension and facet loading.  She has a negative Spurling's test bilaterally.  She has active significant trigger points located in the trapezius musculature levator scapula rhomboid and teres minor.  These reproduce a lot of her pain.  She has no shoulder impingement signs.  She has good strength in the upper extremities bilaterally particularly with wrist extension long finger flexion and abduction.  She has a negative Hoffmann's test bilaterally.  Lymphadenopathy:     Cervical: No cervical adenopathy.  Skin:    General: Skin is warm and dry.     Findings: No erythema or rash.  Neurological:     General: No focal deficit present.     Mental Status: She is alert and oriented to person, place, and time.     Cranial Nerves: No cranial nerve deficit.     Sensory: No sensory deficit.     Motor: No abnormal muscle tone.     Coordination: Coordination normal.     Gait: Gait normal.     Deep Tendon Reflexes: Reflexes normal.  Psychiatric:        Mood and Affect: Mood normal.        Behavior: Behavior normal.        Thought Content: Thought content normal.     Ortho Exam Imaging: No results found.  Past Medical/Family/Surgical/Social History: Medications & Allergies reviewed per EMR, new medications updated. Patient Active Problem List   Diagnosis Date Noted  . Hypermobility of joint 11/01/2016  . DDD (degenerative disc disease), thoracic 11/01/2016  . Osteopenia of multiple sites 11/01/2016  . Positive PPD treated with INH  11/01/2016  . Rheumatoid factor positive -CCP   11/01/2016  . Myofacial muscle pain 03/28/2016  . Insomnia 03/28/2016  . Other fatigue 03/28/2016  . DDD (degenerative disc disease), lumbar 03/28/2016  . DDD (degenerative disc disease), cervical 03/28/2016  . Primary osteoarthritis of both hands 03/28/2016  . Bronchiectasis (HCC) 07/09/2015  . Wheezing 06/16/2015  . Chest crackles 06/16/2015   Past Medical History:  Diagnosis Date  . Fibromyalgia   . Insomnia   . OA (osteoarthritis)   . Positive PPD    Took INH tabs x 3 months   Family History  Problem Relation Age of Onset  . Heart disease Father   . COPD Father   .  Heart disease Paternal Grandmother   . Heart disease Paternal Grandfather    Past Surgical History:  Procedure Laterality Date  . TONSILLECTOMY    . tooth implant Left   . TOTAL ABDOMINAL HYSTERECTOMY  1990   Social History   Occupational History  . Occupation: retired  Tobacco Use  . Smoking status: Never Smoker  . Smokeless tobacco: Never Used  Substance and Sexual Activity  . Alcohol use: No    Alcohol/week: 0.0 standard drinks  . Drug use: No  . Sexual activity: Not on file

## 2018-06-04 ENCOUNTER — Other Ambulatory Visit: Payer: Self-pay | Admitting: Physician Assistant

## 2018-06-04 DIAGNOSIS — M545 Low back pain: Secondary | ICD-10-CM | POA: Diagnosis not present

## 2018-06-04 DIAGNOSIS — M542 Cervicalgia: Secondary | ICD-10-CM | POA: Diagnosis not present

## 2018-06-04 NOTE — Telephone Encounter (Signed)
Last Visit: 04/09/18 Next Visit: 10/08/18 UDS: 04/09/18 Narc Agreement: 04/09/18  Okay to refill Tramadol? 

## 2018-06-08 ENCOUNTER — Other Ambulatory Visit: Payer: Self-pay | Admitting: Rheumatology

## 2018-06-10 ENCOUNTER — Telehealth: Payer: Self-pay | Admitting: Rheumatology

## 2018-06-10 NOTE — Telephone Encounter (Signed)
Patient called stating she spoke with the pharmacist at [facility] who told her to call Dr. Fatima Sanger office regarding her prescription refill of Eszopiclone.   Patient states she was told "Dr. Corliss Skains needed to approve the medicine and send over a paper prescription."  Patient requested a return call.

## 2018-06-10 NOTE — Telephone Encounter (Signed)
Patient advised prescription has been faxed to the pharmacy. Patient verbalized understanding.

## 2018-06-10 NOTE — Telephone Encounter (Signed)
Last Visit: 04/09/18 Next Visit: 10/08/18  Okay to refill Lunesta?

## 2018-06-10 NOTE — Telephone Encounter (Signed)
ok 

## 2018-06-13 DIAGNOSIS — M542 Cervicalgia: Secondary | ICD-10-CM | POA: Diagnosis not present

## 2018-06-13 DIAGNOSIS — M545 Low back pain: Secondary | ICD-10-CM | POA: Diagnosis not present

## 2018-06-19 DIAGNOSIS — M542 Cervicalgia: Secondary | ICD-10-CM | POA: Diagnosis not present

## 2018-06-19 DIAGNOSIS — M545 Low back pain: Secondary | ICD-10-CM | POA: Diagnosis not present

## 2018-07-03 ENCOUNTER — Other Ambulatory Visit: Payer: Self-pay | Admitting: Physician Assistant

## 2018-07-03 NOTE — Telephone Encounter (Signed)
Last Visit: 04/09/18 Next Visit: 10/08/18 UDS: 04/09/18 Narc Agreement: 04/09/18  Okay to refill Tramadol?

## 2018-07-03 NOTE — Telephone Encounter (Signed)
ok 

## 2018-07-04 ENCOUNTER — Telehealth: Payer: Self-pay | Admitting: Rheumatology

## 2018-07-04 NOTE — Telephone Encounter (Signed)
Patient advised we have not refused her medication. Patient advised it has been approved and we have tried faxin the prescription multiple times and it has not went through. Patient advised we will try faxing it again and if it does not go through we will call the pharmacy.

## 2018-07-04 NOTE — Telephone Encounter (Signed)
Patient left a voicemail checking on her refill status of Tramadol.  Patient states her pharmacy told her the prescription was not authorized and to call my provider.  Patient requested a return call.

## 2018-07-08 ENCOUNTER — Other Ambulatory Visit: Payer: Self-pay | Admitting: Rheumatology

## 2018-07-08 NOTE — Telephone Encounter (Signed)
Last Visit: 04/09/2018 Next Visit: 10/08/2018 Last refill: 06/10/2018  Okay to refill eszopiclone?

## 2018-07-17 ENCOUNTER — Ambulatory Visit: Payer: PRIVATE HEALTH INSURANCE | Admitting: Adult Health

## 2018-07-31 ENCOUNTER — Other Ambulatory Visit: Payer: Self-pay | Admitting: Physician Assistant

## 2018-07-31 ENCOUNTER — Other Ambulatory Visit: Payer: Self-pay | Admitting: Rheumatology

## 2018-08-01 NOTE — Telephone Encounter (Signed)
Last Visit: 04/09/2018 Next Visit: 10/08/2018  Okay to refill eszopiclone?

## 2018-08-01 NOTE — Telephone Encounter (Signed)
Last Visit: 04/09/18 Next Visit: 10/08/18 UDS: 04/09/18 Narc Agreement: 04/09/18  Okay to refill Tramadol?

## 2018-09-02 ENCOUNTER — Other Ambulatory Visit: Payer: Self-pay | Admitting: Physician Assistant

## 2018-09-03 NOTE — Telephone Encounter (Signed)
ok 

## 2018-09-03 NOTE — Telephone Encounter (Signed)
Last Visit: 04/09/18 Next Visit: 10/08/18 UDS: 04/09/18 Narc Agreement: 04/09/18  Okay to refill Tramadol and Lunesta?

## 2018-09-23 ENCOUNTER — Other Ambulatory Visit: Payer: Self-pay | Admitting: Rheumatology

## 2018-09-23 NOTE — Telephone Encounter (Signed)
Last Visit: 04/09/18 Next Visit: 10/08/18 Labs: 12/19/17 WNL  Left message to advise patient she due to update labs   Okay to refill 30 day supply Mobic?

## 2018-09-23 NOTE — Telephone Encounter (Signed)
Ok to provide 30-day supply but please advise patient to update lab work ASAP

## 2018-09-24 NOTE — Progress Notes (Signed)
Office Visit Note  Patient: Victoria Watson             Date of Birth: 1951-02-04           MRN: [id number]             PCP: Maurice Small, MD Referring: Maurice Small, MD Visit Date: 10/08/2018 Occupation: @  Subjective:  Neck pain.  History of Present Illness: Victoria Watson is a 68 y.o. female with history of fibromyalgia and osteoarthritis.  She states she continues to have pain and stiffness in her cervical spine.  She was seen by Dr. Alvester Morin after MRI of her cervical spine.  He referred her to [facility] physical therapy for dry needling.  She states she had good relief from that but after the pandemic hit her sessions were discontinued.  She is planning to start right leg sling again soon.  She has been also experiencing some discomfort in her shoulders especially at nighttime.  Her Mobic was discontinued due to elevation of her LFTs.  She states she has been taking extra Tylenol because of the shoulder joint discomfort.  She has been taking tramadol 1 in the morning and another 1 around 2:30 PM for pain relief.  Patient does notice significant improvement in her pain management with tramadol.  Activities of Daily Living:  Patient reports morning stiffness for several hours.   Patient Reports nocturnal pain.  Difficulty dressing/grooming: Denies Difficulty climbing stairs: Denies Difficulty getting out of chair: Denies Difficulty using hands for taps, buttons, cutlery, and/or writing: Reports  Review of Systems  Constitutional: Positive for fatigue. Negative for night sweats, weight gain and weight loss.  HENT: Negative for mouth sores, trouble swallowing, trouble swallowing, mouth dryness and nose dryness.   Eyes: Positive for dryness. Negative for pain, redness, itching and visual disturbance.  Respiratory: Negative for cough, shortness of breath, wheezing and difficulty breathing.   Cardiovascular: Negative for chest pain, palpitations, hypertension,  irregular heartbeat and swelling in legs/feet.  Gastrointestinal: Negative for abdominal pain, blood in stool, constipation and diarrhea.  Endocrine: Negative for increased urination.  Genitourinary: Negative for painful urination, pelvic pain and vaginal dryness.  Musculoskeletal: Positive for arthralgias, joint pain and morning stiffness. Negative for joint swelling, myalgias, muscle weakness, muscle tenderness and myalgias.  Skin: Negative for color change, rash, hair loss, redness, skin tightness, ulcers and sensitivity to sunlight.  Allergic/Immunologic: Negative for susceptible to infections.  Neurological: Positive for weakness. Negative for dizziness, light-headedness, headaches, memory loss and night sweats.  Hematological: Negative for swollen glands.  Psychiatric/Behavioral: Negative for depressed mood, confusion and sleep disturbance. The patient is not nervous/anxious.     PMFS History:  Patient Active Problem List   Diagnosis Date Noted  . Hypermobility of joint 11/01/2016  . DDD (degenerative disc disease), thoracic 11/01/2016  . Osteopenia of multiple sites 11/01/2016  . Positive PPD treated with INH  11/01/2016  . Rheumatoid factor positive -CCP  11/01/2016  . Myofacial muscle pain 03/28/2016  . Insomnia 03/28/2016  . Other fatigue 03/28/2016  . DDD (degenerative disc disease), lumbar 03/28/2016  . DDD (degenerative disc disease), cervical 03/28/2016  . Primary osteoarthritis of both hands 03/28/2016  . Bronchiectasis (HCC) 07/09/2015  . Wheezing 06/16/2015  . Chest crackles 06/16/2015    Past Medical History:  Diagnosis Date  . Fibromyalgia   . Insomnia   . OA (osteoarthritis)   . Positive PPD    Took INH tabs x 3 months    Family History  Problem Relation Age of Onset  . Heart disease Father   . COPD Father   . Heart disease Paternal Grandmother   . Heart disease Paternal Grandfather    Past Surgical History:  Procedure Laterality Date  .  TONSILLECTOMY    . tooth implant Left   . TOTAL ABDOMINAL HYSTERECTOMY  1990   Social History   Social History Narrative  . Not on file   Immunization History  Administered Date(s) Administered  . Influenza,inj,Quad PF,6+ Mos 12/22/2014  . Tdap 09/03/2009     Objective: Vital Signs: BP 132/75 (BP Location: Left Arm, Patient Position: Sitting, Cuff Size: Normal)   Pulse 100   Resp 12   Ht 4' 10.75" (1.492 m)   Wt 96 lb 12.8 oz (43.9 kg)   BMI 19.72 kg/m    Physical Exam Vitals signs and nursing note reviewed.  Constitutional:      Appearance: She is well-developed.  HENT:     Head: Normocephalic and atraumatic.  Eyes:     Conjunctiva/sclera: Conjunctivae normal.  Neck:     Musculoskeletal: Normal range of motion.  Cardiovascular:     Rate and Rhythm: Normal rate and regular rhythm.     Heart sounds: Normal heart sounds.  Pulmonary:     Effort: Pulmonary effort is normal.     Breath sounds: Normal breath sounds.  Abdominal:     General: Bowel sounds are normal.     Palpations: Abdomen is soft.  Lymphadenopathy:     Cervical: No cervical adenopathy.  Skin:    General: Skin is warm and dry.     Capillary Refill: Capillary refill takes less than 2 seconds.  Neurological:     Mental Status: She is alert and oriented to person, place, and time.  Psychiatric:        Behavior: Behavior normal.      Musculoskeletal Exam: C-spine good range of motion with minimal discomfort.  She had good range of motion of her lumbar spine.  Shoulder joints elbow joints wrist joints with good range of motion.  She has DIP and PIP thickening bilaterally without any synovitis.  Hip joints and knee joints with good range of motion.  She has some generalized tenderness and positive tender points.  She has tenderness over insertion of bilateral deltoid muscles.  CDAI Exam: CDAI Score: - Patient Global: -; Provider Global: - Swollen: -; Tender: - Joint Exam   No joint exam has been  documented for this visit   There is currently no information documented on the homunculus. Go to the Rheumatology activity and complete the homunculus joint exam.  Investigation: No additional findings.  Imaging: No results found.  Recent Labs: Lab Results  Component Value Date   WBC 7.2 10/01/2018   HGB 12.2 10/01/2018   PLT 296 10/01/2018   NA 141 10/01/2018   K 5.1 10/01/2018   CL 106 10/01/2018   CO2 26 10/01/2018   GLUCOSE 80 10/01/2018   BUN 15 10/01/2018   CREATININE 0.70 10/01/2018   BILITOT 0.3 10/01/2018   ALKPHOS 62 09/27/2016   AST 35 10/01/2018   ALT 33 (H) 10/01/2018   PROT 6.5 10/01/2018   ALBUMIN 4.0 09/27/2016   CALCIUM 9.2 10/01/2018   GFRAA 105 10/01/2018    Speciality Comments: Narcotic Agreement 02/15/17  Procedures:  No procedures performed Allergies: Patient has no known allergies.   Assessment / Plan:     Visit Diagnoses: Myofascial pain syndrome -patient continues to have some generalized pain and  discomfort from myofascial pain.  She has been having discomfort in her bilateral arms at the deltoid insertion at night.  Have advised her to use some topical Voltaren gel on those areas.  We have advised her not to take Mobic due to elevated LFTs.  She will also delay her dose of tramadol to late evening that will help her discomfort at nighttime.  UDS: June 2020 was consistent with tramadol use.  Narcotic agreement was renewed today.  Other insomnia -is controlled with Lunesta 3 mg tablets by mouth at bedtime for insomnia.    Other fatigue -related to insomnia and fibromyalgia.  Although her symptoms have been better recently.  Primary osteoarthritis of both hands -she has severe osteoarthritis in her hands.  Joint protection muscle strengthening was discussed.  DDD (degenerative disc disease), cervical -she has foraminal narrowing and degenerative disc disease.  She was evaluated by Dr. Alvester MorinNewton and has been going for dry needling.  She had  interruption in her therapy due to pandemic.  She will be starting her therapy soon again.  DDD (degenerative disc disease), thoracic -she has been doing some stretching exercises.  DDD (degenerative disc disease), lumbar -she has good range of motion of her lumbar spine.  Rheumatoid factor positive -CCP  -she had no synovitis on examination.  Osteopenia of multiple sites - Plan: She will continue calcium and vitamin D.  Positive PPD treated with INH   Hypermobility of joint   Orders: No orders of the defined types were placed in this encounter.  No orders of the defined types were placed in this encounter.     Follow-Up Instructions: Return in about 6 months (around 04/09/2019).   Pollyann SavoyShaili Thomasena Vandenheuvel, MD  Note - This record has been created using Animal [employer] software.  Chart creation errors have been sought, but may not always  have been located. Such creation errors do not reflect on  the standard of medical care.

## 2018-10-01 ENCOUNTER — Other Ambulatory Visit: Payer: Self-pay

## 2018-10-01 DIAGNOSIS — Z5181 Encounter for therapeutic drug level monitoring: Secondary | ICD-10-CM | POA: Diagnosis not present

## 2018-10-02 NOTE — Progress Notes (Signed)
ALT mildly elevated. Avoid use of Mobic.

## 2018-10-03 ENCOUNTER — Other Ambulatory Visit: Payer: Self-pay | Admitting: Rheumatology

## 2018-10-03 LAB — PAIN MGMT, PROFILE 5 W/CONF, U
Amphetamines: NEGATIVE ng/mL
Barbiturates: NEGATIVE ng/mL
Benzodiazepines: NEGATIVE ng/mL
Cocaine Metabolite: NEGATIVE ng/mL
Creatinine: 64 mg/dL
Marijuana Metabolite: NEGATIVE ng/mL
Methadone Metabolite: NEGATIVE ng/mL
Opiates: NEGATIVE ng/mL
Oxidant: NEGATIVE ug/mL
Oxycodone: NEGATIVE ng/mL
pH: 5.3 (ref 4.5–9.0)

## 2018-10-03 LAB — CBC WITH DIFFERENTIAL/PLATELET
Absolute Monocytes: 562 cells/uL (ref 200–950)
Basophils Absolute: 50 cells/uL (ref 0–200)
Basophils Relative: 0.7 %
Eosinophils Absolute: 331 cells/uL (ref 15–500)
Eosinophils Relative: 4.6 %
HCT: 38.5 % (ref 35.0–45.0)
Hemoglobin: 12.2 g/dL (ref 11.7–15.5)
Lymphs Abs: 2484 cells/uL (ref 850–3900)
MCH: 27.9 pg (ref 27.0–33.0)
MCHC: 31.7 g/dL — ABNORMAL LOW (ref 32.0–36.0)
MCV: 87.9 fL (ref 80.0–100.0)
MPV: 9.2 fL (ref 7.5–12.5)
Monocytes Relative: 7.8 %
Neutro Abs: 3773 cells/uL (ref 1500–7800)
Neutrophils Relative %: 52.4 %
Platelets: 296 10*3/uL (ref 140–400)
RBC: 4.38 10*6/uL (ref 3.80–5.10)
RDW: 12.9 % (ref 11.0–15.0)
Total Lymphocyte: 34.5 %
WBC: 7.2 10*3/uL (ref 3.8–10.8)

## 2018-10-03 LAB — COMPLETE METABOLIC PANEL WITH GFR
AG Ratio: 1.5 (calc) (ref 1.0–2.5)
ALT: 33 U/L — ABNORMAL HIGH (ref 6–29)
AST: 35 U/L (ref 10–35)
Albumin: 3.9 g/dL (ref 3.6–5.1)
Alkaline phosphatase (APISO): 77 U/L (ref 37–153)
BUN: 15 mg/dL (ref 7–25)
CO2: 26 mmol/L (ref 20–32)
Calcium: 9.2 mg/dL (ref 8.6–10.4)
Chloride: 106 mmol/L (ref 98–110)
Creat: 0.7 mg/dL (ref 0.50–0.99)
GFR, Est African American: 105 mL/min/{1.73_m2} (ref >=60)
GFR, Est Non African American: 91 mL/min/{1.73_m2} (ref >=60)
Globulin: 2.6 g/dL (calc) (ref 1.9–3.7)
Glucose, Bld: 80 mg/dL (ref 65–99)
Potassium: 5.1 mmol/L (ref 3.5–5.3)
Sodium: 141 mmol/L (ref 135–146)
Total Bilirubin: 0.3 mg/dL (ref 0.2–1.2)
Total Protein: 6.5 g/dL (ref 6.1–8.1)

## 2018-10-03 LAB — PAIN MGMT, TRAMADOL W/MEDMATCH, U
Desmethyltramadol: 1019 ng/mL
Tramadol: 4037 ng/mL

## 2018-10-03 NOTE — Telephone Encounter (Signed)
Please advise 

## 2018-10-03 NOTE — Telephone Encounter (Signed)
Last Visit: 04/09/2018 Next Visit: 10/08/2018 UDS:10/01/2018 Narc Agreement: 04/09/2018 (believe she filled out on 10/01/2018 but sent to scan center)  Okay to refill?

## 2018-10-04 NOTE — Telephone Encounter (Signed)
Abnormal labs 09/26/2018 - Patient advised ALT mildly elevated. Patient advised to avoid use of Mobic. Patient verbalized understanding.   Last visit 04/09/18. Prescribed 08/30/17. Please advise.

## 2018-10-04 NOTE — Telephone Encounter (Signed)
Ok to refill 

## 2018-10-08 ENCOUNTER — Ambulatory Visit (INDEPENDENT_AMBULATORY_CARE_PROVIDER_SITE_OTHER): Payer: Medicare HMO | Admitting: Rheumatology

## 2018-10-08 ENCOUNTER — Other Ambulatory Visit: Payer: Self-pay

## 2018-10-08 ENCOUNTER — Encounter: Payer: Self-pay | Admitting: Rheumatology

## 2018-10-08 VITALS — BP 132/75 | HR 100 | Resp 12 | Ht 58.75 in | Wt 96.8 lb

## 2018-10-08 DIAGNOSIS — R5383 Other fatigue: Secondary | ICD-10-CM | POA: Diagnosis not present

## 2018-10-08 DIAGNOSIS — M19041 Primary osteoarthritis, right hand: Secondary | ICD-10-CM | POA: Diagnosis not present

## 2018-10-08 DIAGNOSIS — M8589 Other specified disorders of bone density and structure, multiple sites: Secondary | ICD-10-CM

## 2018-10-08 DIAGNOSIS — M249 Joint derangement, unspecified: Secondary | ICD-10-CM

## 2018-10-08 DIAGNOSIS — R52 Pain, unspecified: Secondary | ICD-10-CM | POA: Diagnosis not present

## 2018-10-08 DIAGNOSIS — M5136 Other intervertebral disc degeneration, lumbar region: Secondary | ICD-10-CM

## 2018-10-08 DIAGNOSIS — G4709 Other insomnia: Secondary | ICD-10-CM

## 2018-10-08 DIAGNOSIS — M5030 Other cervical disc degeneration, unspecified cervical region: Secondary | ICD-10-CM | POA: Diagnosis not present

## 2018-10-08 DIAGNOSIS — M7918 Myalgia, other site: Secondary | ICD-10-CM | POA: Diagnosis not present

## 2018-10-08 DIAGNOSIS — R768 Other specified abnormal immunological findings in serum: Secondary | ICD-10-CM

## 2018-10-08 DIAGNOSIS — M19042 Primary osteoarthritis, left hand: Secondary | ICD-10-CM

## 2018-10-08 DIAGNOSIS — M5134 Other intervertebral disc degeneration, thoracic region: Secondary | ICD-10-CM | POA: Diagnosis not present

## 2018-10-08 DIAGNOSIS — R7611 Nonspecific reaction to tuberculin skin test without active tuberculosis: Secondary | ICD-10-CM

## 2018-10-15 DIAGNOSIS — M545 Low back pain: Secondary | ICD-10-CM | POA: Diagnosis not present

## 2018-10-15 DIAGNOSIS — M542 Cervicalgia: Secondary | ICD-10-CM | POA: Diagnosis not present

## 2018-10-28 ENCOUNTER — Encounter: Payer: Self-pay | Admitting: Adult Health

## 2018-10-28 ENCOUNTER — Other Ambulatory Visit: Payer: Self-pay

## 2018-10-28 ENCOUNTER — Ambulatory Visit (INDEPENDENT_AMBULATORY_CARE_PROVIDER_SITE_OTHER): Payer: Medicare HMO | Admitting: Adult Health

## 2018-10-28 ENCOUNTER — Ambulatory Visit (INDEPENDENT_AMBULATORY_CARE_PROVIDER_SITE_OTHER): Payer: PRIVATE HEALTH INSURANCE

## 2018-10-28 VITALS — BP 130/80 | HR 96 | Ht <= 58 in | Wt 93.6 lb

## 2018-10-28 DIAGNOSIS — J479 Bronchiectasis, uncomplicated: Secondary | ICD-10-CM

## 2018-10-28 DIAGNOSIS — J310 Chronic rhinitis: Secondary | ICD-10-CM | POA: Diagnosis not present

## 2018-10-28 MED ORDER — FLUTTER DEVI: 0 refills | Status: AC

## 2018-10-28 MED ORDER — ALBUTEROL SULFATE HFA 108 (90 BASE) MCG/ACT IN AERS: 2.0000 | INHALATION_SPRAY | Freq: Four times a day (QID) | RESPIRATORY_TRACT | 2 refills | Status: DC | PRN

## 2018-10-28 MED ORDER — BUDESONIDE-FORMOTEROL FUMARATE 80-4.5 MCG/ACT IN AERO: 2.0000 | INHALATION_SPRAY | Freq: Two times a day (BID) | RESPIRATORY_TRACT | 0 refills | Status: DC

## 2018-10-28 MED ORDER — FLUTTER DEVI: 0 refills | Status: DC

## 2018-10-28 NOTE — Patient Instructions (Signed)
Chest xray today .  Saline nasal rinse As needed   Begin Symbicort 80 2 puffs Twice daily  , rinse after use.  Albuterol Inhaler 2 puffs every 4hr as needed Flutter valve  Twice daily   Follow up with Dr. Chase Caller or Madelynn Malson NP in 2 months with PFT (pre test COVID 19 screening order )  Please contact office for sooner follow up if symptoms do not improve or worsen or seek emergency care

## 2018-10-28 NOTE — Progress Notes (Signed)
 @Patient  ID: Victoria Watson, female    DOB: 02-22-1951, 68 y.o.   MRN: [id number]  Chief Complaint  Patient presents with  . Follow-up    bronchiectasis     Referring provider: Maurice SmallGriffin, Elaine, MD  HPI: 68 year old female never smoker seen for pulmonary consult February 2017 for wheezing found to have mild bronchiectasis on CT scan  TEST/EVENTS :  HRCT chest that was neg for ILD. Mild Bronchiectasis/RML scarring. And extensive air trapping throughout lungs indicating small airways dz., 2 incidental LLL nodules 4 mm .   Methacholine challenge test was normal.  10/28/2018 Follow up : Bronchiectasis  Patient returns for a follow-up visit, patient was last seen March 2017.  At that time patient had had some intermittent wheezing cough.  High-resolution CT chest showed no interstitial lung disease.  There was mild bronchiectasis and right middle lobe scarring.  Methacholine challenge test was normal exhaled nitric oxide testing was normal.  She was given pro-air to use as needed.  Patient says overall she has been doing well.  She stays active.  She takes care of her 3 grandchildren babysitting.  And is a caregiver for her mother.  She says over the last 6 months she has noticed some increased nasal drainage congestion and throat clearing.  Some intermittent cough and wheezing.  No discolored mucus.  Cough is minimally productive.  No fever, chest pain, shortness of breath or increased leg swelling. Says she has a good appetite.  Says that she did use her albuterol a few times.  Says that it she needs a new refill as it is greater than [id number] years old.    No Known Allergies  Immunization History  Administered Date(s) Administered  . Influenza,inj,Quad PF,6+ Mos 12/22/2014  . Tdap 09/03/2009    Past Medical History:  Diagnosis Date  . Fibromyalgia   . Insomnia   . OA (osteoarthritis)   . Positive PPD    Took INH tabs x 3 months    Tobacco History: Social History   Tobacco Use   Smoking Status Never Smoker  Smokeless Tobacco Never Used   Counseling given: Not Answered   Outpatient Medications Prior to Visit  Medication Sig Dispense Refill  . Biotin 5 MG TABS Take by mouth daily.    . Calcium Carbonate-Vitamin D (CALCIUM-VITAMIN D) 500-200 MG-UNIT tablet Take 1 tablet by mouth. 2 TO 3 TIMES WEEKLY    . Cholecalciferol (VITAMIN D PO) Take 1 tablet by mouth daily.    . cycloSPORINE (RESTASIS) 0.05 % ophthalmic emulsion Place 1 drop into both eyes 2 (two) times daily.    . diclofenac sodium (VOLTAREN) 1 % GEL Apply 3 grams to 3 large joints up to 3 times daily 3 Tube 3  . estradiol (ESTRACE) 1 MG tablet Take 1 mg by mouth daily.    . Eszopiclone 3 MG TABS TAKE 1 TABLET BY MOUTH EVERY NIGHT AT BEDTIME 30 tablet 0  . Magnesium 250 MG TABS Take by mouth daily.    . Multiple Vitamins-Minerals (MULTIVITAMIN WITH MINERALS) tablet Take 1 tablet by mouth daily.    . traMADol (ULTRAM) 50 MG tablet TAKE 1 TABLET BY MOUTH TWICE DAILY AS NEEDED 60 tablet 0  . VIRT-GARD 2.2-25-1 MG TABS TAKE 1 TABLET BY MOUTH DAILY 90 each 4  . vitamin C (ASCORBIC ACID) 500 MG tablet Take 500 mg by mouth daily.    . meloxicam (MOBIC) 15 MG tablet TAKE 1 TABLET BY MOUTH EVERY DAY 30 tablet 0  No facility-administered medications prior to visit.      Review of Systems:   Constitutional:   No  weight loss, night sweats,  Fevers, chills, fatigue, or  lassitude.  HEENT:   No headaches,  Difficulty swallowing,  Tooth/dental problems, or  Sore throat,                No sneezing, itching, ear ache,  +nasal congestion, post nasal drip,   CV:  No chest pain,  Orthopnea, PND, swelling in lower extremities, anasarca, dizziness, palpitations, syncope.   GI  No heartburn, indigestion, abdominal pain, nausea, vomiting, diarrhea, change in bowel habits, loss of appetite, bloody stools.   Resp: No shortness of breath with exertion or at rest.     No wheezing.  No chest wall deformity  Skin: no rash  or lesions.  GU: no dysuria, change in color of urine, no urgency or frequency.  No flank pain, no hematuria   MS:  No joint pain or swelling.  No decreased range of motion.  No back pain.    Physical Exam  BP 130/80 (BP Location: Left Arm, Cuff Size: Normal)   Pulse 96   Ht 4\' 10"  (1.473 m)   Wt 93 lb 9.6 oz (42.5 kg)   SpO2 99%   BMI 19.56 kg/m   GEN: A/Ox3; pleasant , NAD , thin petite    HEENT:  [place]/AT,    NOSE-clear drainage , THROAT-clear, no lesions, no postnasal drip or exudate noted.   NECK:  Supple w/ fair ROM; no JVD; normal carotid impulses w/o bruits; no thyromegaly or nodules palpated; no lymphadenopathy.    RESP  Few trace wheezes  no accessory muscle use, no dullness to percussion  CARD:  RRR, no m/r/g, no peripheral edema, pulses intact, no cyanosis or clubbing.  GI:   Soft & nt; nml bowel sounds; no organomegaly or masses detected.   Musco: Warm bil, no deformities or joint swelling noted.   Neuro: alert, no focal deficits noted.    Skin: Warm, no lesions or rashes    Lab Results:  CBC    Component Value Date/Time   WBC 7.2 10/01/2018 0956   RBC 4.38 10/01/2018 0956   HGB 12.2 10/01/2018 0956   HCT 38.5 10/01/2018 0956   PLT 296 10/01/2018 0956   MCV 87.9 10/01/2018 0956   MCH 27.9 10/01/2018 0956   MCHC 31.7 (L) 10/01/2018 0956   RDW 12.9 10/01/2018 0956   LYMPHSABS 2,484 10/01/2018 0956   MONOABS 432 09/27/2016 1540   EOSABS 331 10/01/2018 0956   BASOSABS 50 10/01/2018 0956    BMET    Component Value Date/Time   NA 141 10/01/2018 0956   K 5.1 10/01/2018 0956   CL 106 10/01/2018 0956   CO2 26 10/01/2018 0956   GLUCOSE 80 10/01/2018 0956   BUN 15 10/01/2018 0956   CREATININE 0.70 10/01/2018 0956   CALCIUM 9.2 10/01/2018 0956   GFRNONAA 91 10/01/2018 0956   GFRAA 105 10/01/2018 0956    BNP No results found for: BNP  ProBNP No results found for: PROBNP  Imaging: Dg Chest 2 View  Result Date: 10/28/2018 CLINICAL DATA:   Bronchiectasis follow-up. EXAM: CHEST - 2 VIEW COMPARISON:  CT 06/29/2015.  Chest x-ray 07/08/2014. FINDINGS: Mediastinum and hilar structures normal. Lungs are clear. No pleural effusion or pneumothorax. Degenerative change thoracic spine. IMPRESSION: No acute cardiopulmonary disease. Electronically Signed   By: Marcello Moores  Register   On: 10/28/2018 14:41  PFT Results Latest Ref Rng & Units 06/29/2015 06/28/2015  FVC-Pre L 2.33 2.24  FVC-Predicted Pre % 90 86  FVC-Post L 2.17 2.27  FVC-Predicted Post % 83 87  Pre FEV1/FVC % % 78 82  Post FEV1/FCV % % 83 80  FEV1-Pre L 1.82 1.83  FEV1-Predicted Pre % 91 92  FEV1-Post L 1.80 1.83  DLCO UNC% % 108 -  DLCO COR %Predicted % 132 -  TLC L 4.07 -  TLC % Predicted % 96 -  RV % Predicted % 98 -    Lab Results  Component Value Date   NITRICOXIDE 15 06/16/2015        Assessment & Plan:   Bronchiectasis (HCC) Mild flare with sinus drainage /wheezing - Check chest xray today .  Control triggers , add ICS/LABA , flutter  Plan  Patient Instructions  Chest xray today .  Saline nasal rinse As needed   Begin Symbicort 80 2 puffs Twice daily  , rinse after use.  Albuterol Inhaler 2 puffs every 4hr as needed Flutter valve  Twice daily   Follow up with Dr. [name] or Allysen Lazo NP in 2 months with PFT (pre test COVID 19 screening order )  Please contact office for sooner follow up if symptoms do not improve or worsen or seek emergency care       Chronic rhinitis Add Saline nasal rinses As needed       Rubye Oaksammy Tamasha Laplante, NP 10/28/2018

## 2018-10-28 NOTE — Assessment & Plan Note (Signed)
Add Saline nasal rinses As needed

## 2018-10-28 NOTE — Assessment & Plan Note (Signed)
Mild flare with sinus drainage /wheezing - Check chest xray today .  Control triggers , add ICS/LABA , flutter  Plan  Patient Instructions  Chest xray today .  Saline nasal rinse As needed   Begin Symbicort 80 2 puffs Twice daily  , rinse after use.  Albuterol Inhaler 2 puffs every 4hr as needed Flutter valve  Twice daily   Follow up with Dr. Chase Caller or [place] NP in 2 months with PFT (pre test COVID 19 screening order )  Please contact office for sooner follow up if symptoms do not improve or worsen or seek emergency care

## 2018-11-01 ENCOUNTER — Other Ambulatory Visit: Payer: Self-pay | Admitting: Physician Assistant

## 2018-11-01 NOTE — Telephone Encounter (Signed)
I cannot refill through [facility] at this time.  Please send to Dr. Estanislado Pandy or call in verbal order.

## 2018-11-01 NOTE — Telephone Encounter (Signed)
Last Visit: 10/08/18 Next Visit: 04/08/19 UDS:10/01/18 Narc Agreement: 04/09/18  Okay to refill Tramadol and Eszopiclone?

## 2018-12-02 ENCOUNTER — Other Ambulatory Visit: Payer: Self-pay | Admitting: Rheumatology

## 2018-12-02 NOTE — Telephone Encounter (Signed)
Last Visit: 10/08/18 Next Visit: 04/08/19 UDS:10/01/18 Narc Agreement: 10/08/18  Okay to refill Tramadol and Eszopiclone? 

## 2018-12-02 NOTE — Telephone Encounter (Signed)
ok 

## 2018-12-31 ENCOUNTER — Other Ambulatory Visit: Payer: Self-pay | Admitting: Rheumatology

## 2018-12-31 ENCOUNTER — Telehealth: Payer: Self-pay | Admitting: Adult Health

## 2018-12-31 MED ORDER — BUDESONIDE-FORMOTEROL FUMARATE 80-4.5 MCG/ACT IN AERO: 2.0000 | INHALATION_SPRAY | Freq: Two times a day (BID) | RESPIRATORY_TRACT | 3 refills | Status: DC

## 2018-12-31 NOTE — Telephone Encounter (Signed)
Last Visit: 10/08/18 Next Visit: 04/08/19 UDS:10/01/18 Narc Agreement: 10/08/18  Okay to refill Tramadol and Eszopiclone?

## 2018-12-31 NOTE — Telephone Encounter (Signed)
Spoke with pt and advised rx for Symbicort sent to [place]. Nothing further is needed.

## 2019-01-30 ENCOUNTER — Other Ambulatory Visit: Payer: Self-pay | Admitting: Rheumatology

## 2019-01-30 MED ORDER — TRAMADOL HCL 50 MG PO TABS: 50.0000 mg | ORAL_TABLET | Freq: Two times a day (BID) | ORAL | 0 refills | Status: DC | PRN

## 2019-01-30 MED ORDER — ESZOPICLONE 2 MG PO TABS: 2.0000 mg | ORAL_TABLET | Freq: Every evening | ORAL | 0 refills | Status: DC | PRN

## 2019-01-30 NOTE — Telephone Encounter (Signed)
Last Visit: 10/08/18 Next Visit: 04/08/19 UDS:10/01/18 Narc Agreement: 10/08/18  Okay to refill Tramadol and Eszopiclone?

## 2019-01-30 NOTE — Telephone Encounter (Signed)
Patient called requesting prescription refill of Tramadol and Eszopiclone to be sent to [facility] at [address]

## 2019-01-30 NOTE — Telephone Encounter (Signed)
Attempted to contact the patient and left message for patient to call the office.  

## 2019-01-30 NOTE — Telephone Encounter (Signed)
Please advise patient that we will have to reduce the dose of Eszopiclone to 2 mg at bedtime per pharmacy recommendation.  It is not getting approved because of her age.  If she is in agreement then we can refill the medication.

## 2019-02-15 DIAGNOSIS — Z23 Encounter for immunization: Secondary | ICD-10-CM | POA: Diagnosis not present

## 2019-02-26 DIAGNOSIS — H52223 Regular astigmatism, bilateral: Secondary | ICD-10-CM | POA: Diagnosis not present

## 2019-02-28 ENCOUNTER — Other Ambulatory Visit: Payer: Self-pay | Admitting: Rheumatology

## 2019-02-28 NOTE — Telephone Encounter (Signed)
Last Visit: 10/08/2018 Next Visit: 04/08/2019  Last fill: 01/30/2019   Okay to refill lunesta?

## 2019-03-07 ENCOUNTER — Other Ambulatory Visit: Payer: Self-pay

## 2019-03-07 MED ORDER — TRAMADOL HCL 50 MG PO TABS: 50.0000 mg | ORAL_TABLET | Freq: Two times a day (BID) | ORAL | 0 refills | Status: DC | PRN

## 2019-03-07 NOTE — Telephone Encounter (Addendum)
Received refill request via fax.   Last Visit: 10/08/2018 Next Visit: 04/08/2019 UDS: 10/01/2018  Narc Agreement: 10/08/2018  Last fill: 01/30/2019  Okay to refill tramadol?

## 2019-03-31 ENCOUNTER — Other Ambulatory Visit: Payer: Self-pay | Admitting: Rheumatology

## 2019-04-01 NOTE — Telephone Encounter (Signed)
Last Visit: 10/08/2018 Next Visit: 04/08/2019  Last fill: 02/28/2019   Okay to refill lunesta?

## 2019-04-01 NOTE — Telephone Encounter (Signed)
ok 

## 2019-04-04 ENCOUNTER — Other Ambulatory Visit: Payer: Self-pay

## 2019-04-04 DIAGNOSIS — Z5181 Encounter for therapeutic drug level monitoring: Secondary | ICD-10-CM

## 2019-04-06 LAB — CBC WITH DIFFERENTIAL/PLATELET
Absolute Monocytes: 483 cells/uL (ref 200–950)
Basophils Absolute: 82 cells/uL (ref 0–200)
Basophils Relative: 1.2 %
Eosinophils Absolute: 88 cells/uL (ref 15–500)
Eosinophils Relative: 1.3 %
HCT: 38.3 % (ref 35.0–45.0)
Hemoglobin: 12.7 g/dL (ref 11.7–15.5)
Lymphs Abs: 2625 cells/uL (ref 850–3900)
MCH: 30.2 pg (ref 27.0–33.0)
MCHC: 33.2 g/dL (ref 32.0–36.0)
MCV: 91.2 fL (ref 80.0–100.0)
MPV: 9.5 fL (ref 7.5–12.5)
Monocytes Relative: 7.1 %
Neutro Abs: 3522 cells/uL (ref 1500–7800)
Neutrophils Relative %: 51.8 %
Platelets: 287 10*3/uL (ref 140–400)
RBC: 4.2 10*6/uL (ref 3.80–5.10)
RDW: 12.7 % (ref 11.0–15.0)
Total Lymphocyte: 38.6 %
WBC: 6.8 10*3/uL (ref 3.8–10.8)

## 2019-04-06 LAB — PAIN MGMT, PROFILE 5 W/CONF, U
Amphetamines: NEGATIVE ng/mL
Barbiturates: NEGATIVE ng/mL
Benzodiazepines: NEGATIVE ng/mL
Cocaine Metabolite: NEGATIVE ng/mL
Creatinine: 78.6 mg/dL
Marijuana Metabolite: NEGATIVE ng/mL
Methadone Metabolite: NEGATIVE ng/mL
Opiates: NEGATIVE ng/mL
Oxidant: NEGATIVE ug/mL
Oxycodone: NEGATIVE ng/mL
pH: 7.1 (ref 4.5–9.0)

## 2019-04-06 LAB — COMPLETE METABOLIC PANEL WITH GFR
AG Ratio: 1.5 (calc) (ref 1.0–2.5)
ALT: 20 U/L (ref 6–29)
AST: 25 U/L (ref 10–35)
Albumin: 4.1 g/dL (ref 3.6–5.1)
Alkaline phosphatase (APISO): 52 U/L (ref 37–153)
BUN: 13 mg/dL (ref 7–25)
CO2: 26 mmol/L (ref 20–32)
Calcium: 9.5 mg/dL (ref 8.6–10.4)
Chloride: 103 mmol/L (ref 98–110)
Creat: 0.87 mg/dL (ref 0.50–0.99)
GFR, Est African American: 80 mL/min/{1.73_m2} (ref >=60)
GFR, Est Non African American: 69 mL/min/{1.73_m2} (ref >=60)
Globulin: 2.8 g/dL (calc) (ref 1.9–3.7)
Glucose, Bld: 87 mg/dL (ref 65–99)
Potassium: 4.8 mmol/L (ref 3.5–5.3)
Sodium: 139 mmol/L (ref 135–146)
Total Bilirubin: 0.4 mg/dL (ref 0.2–1.2)
Total Protein: 6.9 g/dL (ref 6.1–8.1)

## 2019-04-06 LAB — PAIN MGMT, TRAMADOL W/MEDMATCH, U
Desmethyltramadol: 644 ng/mL
Tramadol: 1489 ng/mL

## 2019-04-07 ENCOUNTER — Ambulatory Visit (INDEPENDENT_AMBULATORY_CARE_PROVIDER_SITE_OTHER): Payer: Medicare HMO | Admitting: Rheumatology

## 2019-04-07 ENCOUNTER — Encounter: Payer: Self-pay | Admitting: Rheumatology

## 2019-04-07 ENCOUNTER — Other Ambulatory Visit: Payer: Self-pay

## 2019-04-07 VITALS — BP 130/77 | HR 80 | Resp 13 | Ht 58.5 in | Wt 105.2 lb

## 2019-04-07 DIAGNOSIS — M5030 Other cervical disc degeneration, unspecified cervical region: Secondary | ICD-10-CM | POA: Diagnosis not present

## 2019-04-07 DIAGNOSIS — M249 Joint derangement, unspecified: Secondary | ICD-10-CM

## 2019-04-07 DIAGNOSIS — G4709 Other insomnia: Secondary | ICD-10-CM | POA: Diagnosis not present

## 2019-04-07 DIAGNOSIS — M19042 Primary osteoarthritis, left hand: Secondary | ICD-10-CM

## 2019-04-07 DIAGNOSIS — R5383 Other fatigue: Secondary | ICD-10-CM | POA: Diagnosis not present

## 2019-04-07 DIAGNOSIS — R768 Other specified abnormal immunological findings in serum: Secondary | ICD-10-CM | POA: Diagnosis not present

## 2019-04-07 DIAGNOSIS — M5134 Other intervertebral disc degeneration, thoracic region: Secondary | ICD-10-CM | POA: Diagnosis not present

## 2019-04-07 DIAGNOSIS — M7918 Myalgia, other site: Secondary | ICD-10-CM

## 2019-04-07 DIAGNOSIS — Z79899 Other long term (current) drug therapy: Secondary | ICD-10-CM

## 2019-04-07 DIAGNOSIS — M5136 Other intervertebral disc degeneration, lumbar region: Secondary | ICD-10-CM

## 2019-04-07 DIAGNOSIS — M8589 Other specified disorders of bone density and structure, multiple sites: Secondary | ICD-10-CM

## 2019-04-07 DIAGNOSIS — M19041 Primary osteoarthritis, right hand: Secondary | ICD-10-CM

## 2019-04-07 DIAGNOSIS — M542 Cervicalgia: Secondary | ICD-10-CM | POA: Diagnosis not present

## 2019-04-07 DIAGNOSIS — R52 Pain, unspecified: Secondary | ICD-10-CM

## 2019-04-07 DIAGNOSIS — R7611 Nonspecific reaction to tuberculin skin test without active tuberculosis: Secondary | ICD-10-CM

## 2019-04-07 MED ORDER — TRIAMCINOLONE ACETONIDE 40 MG/ML IJ SUSP
10.0000 mg | INTRAMUSCULAR | Status: AC | PRN
  Administered 2019-04-07: 10 mg via INTRAMUSCULAR

## 2019-04-07 MED ORDER — LIDOCAINE HCL 1 % IJ SOLN
0.5000 mL | INTRAMUSCULAR | Status: AC | PRN
  Administered 2019-04-07: .5 mL

## 2019-04-07 NOTE — Patient Instructions (Signed)

## 2019-04-07 NOTE — Progress Notes (Signed)
Office Visit Note  Patient: Victoria Watson             Date of Birth: [birth year]           MRN: [id number]             PCP: Maurice Small, MD Referring: Maurice Small, MD Visit Date: 04/07/2019 Occupation: @GUAROCC @  Subjective:  Other (patient request trigger point injections )   History of Present Illness: DIANI JILLSON is a 68 y.o. female with history of fibromyalgia, osteoarthritis and degenerative disc disease.  She states she has been experiencing a lot of pain and discomfort in the bilateral trapezius area for the last 3 months.  She has been also experiencing some lower back pain for the last 3 months.  She denies any joint pain or joint swelling.  She states that she has discontinued Mobic due to elevated LFTs.  She has been taking tramadol twice a day on a regular basis.  She states she is unable to function without the tramadol.  Activities of Daily Living:  Patient reports morning stiffness for 24 hours.   Patient Reports nocturnal pain.  Difficulty dressing/grooming: Denies Difficulty climbing stairs: Denies Difficulty getting out of chair: Reports Difficulty using hands for taps, buttons, cutlery, and/or writing: Reports  Review of Systems  Constitutional: Positive for fatigue.  HENT: Positive for mouth dryness and nose dryness. Negative for mouth sores.   Eyes: Positive for dryness.  Respiratory: Negative for shortness of breath, wheezing and difficulty breathing.   Cardiovascular: Negative for chest pain and palpitations.  Gastrointestinal: Negative for blood in stool, constipation and diarrhea.  Endocrine: Negative for increased urination.  Genitourinary: Negative for difficulty urinating and painful urination.  Musculoskeletal: Positive for arthralgias, joint pain and morning stiffness. Negative for joint swelling.  Skin: Negative for rash and hair loss.  Allergic/Immunologic: Negative for susceptible to infections.  Neurological: Positive for  numbness, headaches and weakness. Negative for dizziness, light-headedness and memory loss.  Hematological: Negative for bruising/bleeding tendency.  Psychiatric/Behavioral: Positive for sleep disturbance. Negative for confusion.    PMFS History:  Patient Active Problem List   Diagnosis Date Noted  . Chronic rhinitis 10/28/2018  . Hypermobility of joint 11/01/2016  . DDD (degenerative disc disease), thoracic 11/01/2016  . Osteopenia of multiple sites 11/01/2016  . Positive PPD treated with INH  11/01/2016  . Rheumatoid factor positive -CCP  11/01/2016  . Myofacial muscle pain 03/28/2016  . Insomnia 03/28/2016  . Other fatigue 03/28/2016  . DDD (degenerative disc disease), lumbar 03/28/2016  . DDD (degenerative disc disease), cervical 03/28/2016  . Primary osteoarthritis of both hands 03/28/2016  . Bronchiectasis (HCC) 07/09/2015  . Wheezing 06/16/2015  . Chest crackles 06/16/2015    Past Medical History:  Diagnosis Date  . Fibromyalgia   . Insomnia   . OA (osteoarthritis)   . Positive PPD    Took INH tabs x 3 months    Family History  Problem Relation Age of Onset  . Heart disease Father   . COPD Father   . Heart disease Paternal Grandmother   . Heart disease Paternal Grandfather    Past Surgical History:  Procedure Laterality Date  . TONSILLECTOMY    . tooth implant Left   . TOTAL ABDOMINAL HYSTERECTOMY  1990   Social History   Social History Narrative  . Not on file   Immunization History  Administered Date(s) Administered  . Influenza,inj,Quad PF,6+ Mos 12/22/2014  . Tdap 09/03/2009     Objective:  Vital Signs: BP 130/77 (BP Location: Left Arm, Patient Position: Sitting, Cuff Size: Normal)   Pulse 80   Resp 13   Ht 4' 10.5" (1.486 m)   Wt 105 lb 3.2 oz (47.7 kg)   BMI 21.61 kg/m    Physical Exam Vitals and nursing note reviewed.  Constitutional:      Appearance: She is well-developed.  HENT:     Head: Normocephalic and atraumatic.  Eyes:      Conjunctiva/sclera: Conjunctivae normal.  Cardiovascular:     Rate and Rhythm: Normal rate and regular rhythm.     Heart sounds: Normal heart sounds.  Pulmonary:     Effort: Pulmonary effort is normal.     Breath sounds: Normal breath sounds.  Abdominal:     General: Bowel sounds are normal.     Palpations: Abdomen is soft.  Musculoskeletal:     Cervical back: Normal range of motion.  Lymphadenopathy:     Cervical: No cervical adenopathy.  Skin:    General: Skin is warm and dry.     Capillary Refill: Capillary refill takes less than 2 seconds.  Neurological:     Mental Status: She is alert and oriented to person, place, and time.  Psychiatric:        Behavior: Behavior normal.      Musculoskeletal Exam: C-spine and lumbar spine with full range of motion with some discomfort.  Shoulder joints, elbow joints, wrist joints were in good range of motion.  She has bilateral mild DIP and PIP thickening.  Hip joints, knee joints, ankles, MTPs and PIPs with good range of motion with no synovitis.  CDAI Exam: CDAI Score: - Patient Global: -; Provider Global: - Swollen: -; Tender: - Joint Exam 04/07/2019   No joint exam has been documented for this visit   There is currently no information documented on the homunculus. Go to the Rheumatology activity and complete the homunculus joint exam.  Investigation: No additional findings.  Imaging: No results found.  Recent Labs: Lab Results  Component Value Date   WBC 6.8 04/04/2019   HGB 12.7 04/04/2019   PLT 287 04/04/2019   NA 139 04/04/2019   K 4.8 04/04/2019   CL 103 04/04/2019   CO2 26 04/04/2019   GLUCOSE 87 04/04/2019   BUN 13 04/04/2019   CREATININE 0.87 04/04/2019   BILITOT 0.4 04/04/2019   ALKPHOS 62 09/27/2016   AST 25 04/04/2019   ALT 20 04/04/2019   PROT 6.9 04/04/2019   ALBUMIN 4.0 09/27/2016   CALCIUM 9.5 04/04/2019   GFRAA 80 04/04/2019    Speciality Comments: Narcotic Agreement 02/15/17  Procedures:   Trigger Point Inj  Date/Time: 04/07/2019 1:42 PM Performed by: Bo Merino, MD Authorized by: Bo Merino, MD   Consent Given by:  Patient Indications:  Muscle spasm Total # of Trigger Points:  2 Location: neck   Needle Size:  27 G Medications #1:  0.5 mL lidocaine 1 %; 10 mg triamcinolone acetonide 40 MG/ML Medications #2:  0.5 mL lidocaine 1 %; 10 mg triamcinolone acetonide 40 MG/ML   Allergies: Patient has no known allergies.   Assessment / Plan:     Visit Diagnoses: Myofascial pain syndrome-she continues to have some generalized pain discomfort and positive tender points.  Need for regular exercise and good sleep hygiene was discussed.  Other insomnia-she states her insomnia is better with the medications.  Other fatigue-secondary to insomnia.  Primary osteoarthritis of both hands-joint protection was discussed.  Neck pain-she admits to  experiencing pain and stiffness in her bilateral trapezius area.  She requested bilateral trapezius injections.  After informed consent was obtained bilateral trapezius areas were injected with cortisone as described below.  She tolerated the procedure well.  DDD (degenerative disc disease), cervical-chronic pain  DDD (degenerative disc disease), thoracic-chronic pain  DDD (degenerative disc disease), lumbar-chronic pain she was advised to do some stretching exercises.  Rheumatoid factor positive -CCP.  She has no synovitis.  Pain management-I reviewed most recent labs with her.  Her UDS did not show tramadol.  Patient states she has been taking it on regular basis.  She cannot function without it.  We will continue to monitor her UDS.  She is not taking any other narcotics.  We will repeat UDS in 3 months.  Hypermobility of joint  Osteopenia of multiple sites-she is on calcium and vitamin D.  Positive PPD treated with INH   Orders: Orders Placed This Encounter  Procedures  . Trigger Point Inj   No orders of the  defined types were placed in this encounter.   Face-to-face time spent with patient was 25 minutes. Greater than 50% of time was spent in counseling and coordination of care.  Follow-Up Instructions: Return in about 6 months (around 10/06/2019) for Myofascial pain syndrome, osteoarthritis.   Pollyann SavoyShaili Xzavior Reinig, MD  Note - This record has been created using Animal nutritionistDragon software.  Chart creation errors have been sought, but may not always  have been located. Such creation errors do not reflect on  the standard of medical care.

## 2019-04-08 ENCOUNTER — Ambulatory Visit: Payer: PRIVATE HEALTH INSURANCE | Admitting: Rheumatology

## 2019-04-14 ENCOUNTER — Telehealth: Payer: Self-pay | Admitting: Rheumatology

## 2019-04-14 MED ORDER — TRAMADOL HCL 50 MG PO TABS: 50.0000 mg | ORAL_TABLET | Freq: Two times a day (BID) | ORAL | 0 refills | Status: DC | PRN

## 2019-04-14 NOTE — Telephone Encounter (Signed)
Last Visit: 04/07/2019 Next Visit: 09/10/2019 UDS:04/04/2019  Narc Agreement: 10/08/2018  Last fill: 03/07/2019  Okay to refill tramadol?

## 2019-04-14 NOTE — Telephone Encounter (Signed)
Patient called requesting prescription refill of Tramadol to be sent to [facility] at [address]

## 2019-04-30 ENCOUNTER — Telehealth: Payer: Self-pay | Admitting: Rheumatology

## 2019-04-30 MED ORDER — ESZOPICLONE 3 MG PO TABS: 3.0000 mg | ORAL_TABLET | Freq: Every day | ORAL | 2 refills | Status: DC

## 2019-04-30 NOTE — Telephone Encounter (Signed)
Last Visit:04/07/2019  Next Visit: 09/10/2019  Last fill: 04/01/2019 (2mg  tablets)  lunesta dose was decreased to 2mg  at bedtime per pharmacy recommendation 01/30/2019, it was not getting approved because of her age.   Patient reports the 2mg  is not helping and is requesting to go back on 3mg  at bedtime.    Okay to refill lunesta?

## 2019-04-30 NOTE — Telephone Encounter (Signed)
Patient calling to request a refill on Eszopiclone . Patient has new insurance now, and would like to go back to 3 mg instead of 2 mg. Patients previous insurance required the change due to patients age, and 2 mg does not help. Patient also wanted rx sent to [facility] on [address] They will except the Good RX coupon. (this is a new pharmacy)

## 2019-04-30 NOTE — Telephone Encounter (Signed)
Okay to give Lunesta 3 mg p.o. nightly #30 Rx2

## 2019-05-01 DIAGNOSIS — M797 Fibromyalgia: Secondary | ICD-10-CM | POA: Diagnosis not present

## 2019-05-01 DIAGNOSIS — Z0000 Encounter for general adult medical examination without abnormal findings: Secondary | ICD-10-CM | POA: Diagnosis not present

## 2019-05-01 DIAGNOSIS — Z1159 Encounter for screening for other viral diseases: Secondary | ICD-10-CM | POA: Diagnosis not present

## 2019-05-01 DIAGNOSIS — J309 Allergic rhinitis, unspecified: Secondary | ICD-10-CM | POA: Diagnosis not present

## 2019-05-01 DIAGNOSIS — J479 Bronchiectasis, uncomplicated: Secondary | ICD-10-CM | POA: Diagnosis not present

## 2019-05-01 DIAGNOSIS — M1990 Unspecified osteoarthritis, unspecified site: Secondary | ICD-10-CM | POA: Diagnosis not present

## 2019-05-01 DIAGNOSIS — R252 Cramp and spasm: Secondary | ICD-10-CM | POA: Diagnosis not present

## 2019-05-06 DIAGNOSIS — Z03818 Encounter for observation for suspected exposure to other biological agents ruled out: Secondary | ICD-10-CM | POA: Diagnosis not present

## 2019-05-06 DIAGNOSIS — Z20828 Contact with and (suspected) exposure to other viral communicable diseases: Secondary | ICD-10-CM | POA: Diagnosis not present

## 2019-05-06 DIAGNOSIS — U071 COVID-19: Secondary | ICD-10-CM | POA: Diagnosis not present

## 2019-05-14 ENCOUNTER — Other Ambulatory Visit: Payer: Self-pay | Admitting: *Deleted

## 2019-05-14 MED ORDER — TRAMADOL HCL 50 MG PO TABS: 50.0000 mg | ORAL_TABLET | Freq: Two times a day (BID) | ORAL | 0 refills | Status: DC | PRN

## 2019-05-14 NOTE — Telephone Encounter (Signed)
Last Visit: 04/07/2019 Next Visit: 09/10/2019 UDS:04/04/2019  Narc Agreement: 10/08/2018  Last fill: 04/14/19  Okay to refill tramadol?

## 2019-05-19 DIAGNOSIS — Z23 Encounter for immunization: Secondary | ICD-10-CM | POA: Diagnosis not present

## 2019-05-19 DIAGNOSIS — Z1159 Encounter for screening for other viral diseases: Secondary | ICD-10-CM | POA: Diagnosis not present

## 2019-05-21 DIAGNOSIS — R69 Illness, unspecified: Secondary | ICD-10-CM | POA: Diagnosis not present

## 2019-06-10 ENCOUNTER — Other Ambulatory Visit: Payer: Self-pay | Admitting: Adult Health

## 2019-06-10 MED ORDER — BUDESONIDE-FORMOTEROL FUMARATE 80-4.5 MCG/ACT IN AERO: 2.0000 | INHALATION_SPRAY | Freq: Two times a day (BID) | RESPIRATORY_TRACT | 2 refills | Status: DC

## 2019-06-10 NOTE — Telephone Encounter (Signed)
Received faxed refill request from [facility] [address]  Medication name/strength/dose: Symbicort 80 Medication last rx'd: 12/31/2018 Quantity and number of refills last rx'd: #1 with 3 refills  Patient last seen in the office on 10/28/2018: Patient Instructions  Chest xray today .  Saline nasal rinse As needed   Begin Symbicort 80 2 puffs Twice daily  , rinse after use.  Albuterol Inhaler 2 puffs every 4hr as needed Flutter valve  Twice daily   Follow up with Dr. Marchelle Gearing or [place] NP in 2 months with PFT (pre test COVID 19 screening order )  Please contact office for sooner follow up if symptoms do not improve or worsen or seek emergency care      Does refill need to be authorized by a provider? no Refill authorized (yes or no)?: yes - but patient is overdue for appt.  Will refill with note that patient needs to follow up.

## 2019-06-11 DIAGNOSIS — R69 Illness, unspecified: Secondary | ICD-10-CM | POA: Diagnosis not present

## 2019-06-17 ENCOUNTER — Other Ambulatory Visit: Payer: Self-pay | Admitting: *Deleted

## 2019-06-17 MED ORDER — TRAMADOL HCL 50 MG PO TABS: 50.0000 mg | ORAL_TABLET | Freq: Two times a day (BID) | ORAL | 0 refills | Status: DC | PRN

## 2019-06-17 NOTE — Telephone Encounter (Signed)
Refill request received via fax  Last Visit:04/07/2019 Next Visit:09/10/2019 UDS:04/04/2019 Narc Agreement:10/08/2018  Last fill: 05/14/19  Okay to refill Tramadol?

## 2019-06-24 DIAGNOSIS — Z6821 Body mass index (BMI) 21.0-21.9, adult: Secondary | ICD-10-CM | POA: Diagnosis not present

## 2019-06-24 DIAGNOSIS — Z124 Encounter for screening for malignant neoplasm of cervix: Secondary | ICD-10-CM | POA: Diagnosis not present

## 2019-06-24 DIAGNOSIS — Z1272 Encounter for screening for malignant neoplasm of vagina: Secondary | ICD-10-CM | POA: Diagnosis not present

## 2019-06-24 DIAGNOSIS — Z1231 Encounter for screening mammogram for malignant neoplasm of breast: Secondary | ICD-10-CM | POA: Diagnosis not present

## 2019-07-16 ENCOUNTER — Other Ambulatory Visit: Payer: Self-pay | Admitting: *Deleted

## 2019-07-16 MED ORDER — TRAMADOL HCL 50 MG PO TABS: 50.0000 mg | ORAL_TABLET | Freq: Two times a day (BID) | ORAL | 0 refills | Status: DC | PRN

## 2019-07-16 NOTE — Telephone Encounter (Signed)
Refill request received via fax  Last Visit:04/07/2019 Next Visit:09/10/2019 UDS:04/04/2019 Narc Agreement:10/08/2018  Last fill:06/17/19  Okay to refill Tramadol?

## 2019-07-23 ENCOUNTER — Other Ambulatory Visit: Payer: Self-pay | Admitting: Rheumatology

## 2019-07-24 ENCOUNTER — Other Ambulatory Visit: Payer: Self-pay | Admitting: Rheumatology

## 2019-07-24 NOTE — Telephone Encounter (Signed)
Last Visit:04/07/2019 Next Visit:09/10/2019  Okay to refill Eszopiclone?

## 2019-07-29 ENCOUNTER — Ambulatory Visit: Payer: Self-pay

## 2019-07-29 ENCOUNTER — Other Ambulatory Visit: Payer: Self-pay

## 2019-07-29 ENCOUNTER — Encounter: Payer: Self-pay | Admitting: Physician Assistant

## 2019-07-29 ENCOUNTER — Ambulatory Visit (INDEPENDENT_AMBULATORY_CARE_PROVIDER_SITE_OTHER): Payer: Medicare HMO | Admitting: Physician Assistant

## 2019-07-29 VITALS — BP 132/67 | HR 80 | Resp 14 | Ht 58.5 in | Wt 101.8 lb

## 2019-07-29 DIAGNOSIS — M25561 Pain in right knee: Secondary | ICD-10-CM | POA: Diagnosis not present

## 2019-07-29 DIAGNOSIS — M5030 Other cervical disc degeneration, unspecified cervical region: Secondary | ICD-10-CM

## 2019-07-29 DIAGNOSIS — R7611 Nonspecific reaction to tuberculin skin test without active tuberculosis: Secondary | ICD-10-CM

## 2019-07-29 DIAGNOSIS — M25562 Pain in left knee: Secondary | ICD-10-CM | POA: Diagnosis not present

## 2019-07-29 DIAGNOSIS — R5383 Other fatigue: Secondary | ICD-10-CM | POA: Diagnosis not present

## 2019-07-29 DIAGNOSIS — R768 Other specified abnormal immunological findings in serum: Secondary | ICD-10-CM | POA: Diagnosis not present

## 2019-07-29 DIAGNOSIS — M7918 Myalgia, other site: Secondary | ICD-10-CM | POA: Diagnosis not present

## 2019-07-29 DIAGNOSIS — M8589 Other specified disorders of bone density and structure, multiple sites: Secondary | ICD-10-CM

## 2019-07-29 DIAGNOSIS — G4709 Other insomnia: Secondary | ICD-10-CM

## 2019-07-29 DIAGNOSIS — G8929 Other chronic pain: Secondary | ICD-10-CM

## 2019-07-29 DIAGNOSIS — M25552 Pain in left hip: Secondary | ICD-10-CM

## 2019-07-29 DIAGNOSIS — M19041 Primary osteoarthritis, right hand: Secondary | ICD-10-CM

## 2019-07-29 DIAGNOSIS — M5136 Other intervertebral disc degeneration, lumbar region: Secondary | ICD-10-CM | POA: Diagnosis not present

## 2019-07-29 DIAGNOSIS — M5134 Other intervertebral disc degeneration, thoracic region: Secondary | ICD-10-CM | POA: Diagnosis not present

## 2019-07-29 DIAGNOSIS — M19042 Primary osteoarthritis, left hand: Secondary | ICD-10-CM

## 2019-07-29 NOTE — Patient Instructions (Signed)
Journal for Nurse Practitioners, 15(4), 263-267. Retrieved January 28, 2018 from http://clinicalkey.com/nursing">  Knee Exercises Ask your health care provider which exercises are safe for you. Do exercises exactly as told by your health care provider and adjust them as directed. It is normal to feel mild stretching, pulling, tightness, or discomfort as you do these exercises. Stop right away if you feel sudden pain or your pain gets worse. Do not begin these exercises until told by your health care provider. Stretching and range-of-motion exercises These exercises warm up your muscles and joints and improve the movement and flexibility of your knee. These exercises also help to relieve pain and swelling. Knee extension, prone 1. Lie on your abdomen (prone position) on a bed. 2. Place your left / right knee just beyond the edge of the surface so your knee is not on the bed. You can put a towel under your left / right thigh just above your kneecap for comfort. 3. Relax your leg muscles and allow gravity to straighten your knee (extension). You should feel a stretch behind your left / right knee. 4. Hold this position for __________ seconds. 5. Scoot up so your knee is supported between repetitions. Repeat __________ times. Complete this exercise __________ times a day. Knee flexion, active  1. Lie on your back with both legs straight. If this causes back discomfort, bend your left / right knee so your foot is flat on the floor. 2. Slowly slide your left / right heel back toward your buttocks. Stop when you feel a gentle stretch in the front of your knee or thigh (flexion). 3. Hold this position for __________ seconds. 4. Slowly slide your left / right heel back to the starting position. Repeat __________ times. Complete this exercise __________ times a day. Quadriceps stretch, prone  1. Lie on your abdomen on a firm surface, such as a bed or padded floor. 2. Bend your left / right knee and hold  your ankle. If you cannot reach your ankle or pant leg, loop a belt around your foot and grab the belt instead. 3. Gently pull your heel toward your buttocks. Your knee should not slide out to the side. You should feel a stretch in the front of your thigh and knee (quadriceps). 4. Hold this position for __________ seconds. Repeat __________ times. Complete this exercise __________ times a day. Hamstring, supine 1. Lie on your back (supine position). 2. Loop a belt or towel over the ball of your left / right foot. The ball of your foot is on the walking surface, right under your toes. 3. Straighten your left / right knee and slowly pull on the belt to raise your leg until you feel a gentle stretch behind your knee (hamstring). ? Do not let your knee bend while you do this. ? Keep your other leg flat on the floor. 4. Hold this position for __________ seconds. Repeat __________ times. Complete this exercise __________ times a day. Strengthening exercises These exercises build strength and endurance in your knee. Endurance is the ability to use your muscles for a long time, even after they get tired. Quadriceps, isometric This exercise stretches the muscles in front of your thigh (quadriceps) without moving your knee joint (isometric). 1. Lie on your back with your left / right leg extended and your other knee bent. Put a rolled towel or small pillow under your knee if told by your health care provider. 2. Slowly tense the muscles in the front of your left /   right thigh. You should see your kneecap slide up toward your hip or see increased dimpling just above the knee. This motion will push the back of the knee toward the floor. 3. For __________ seconds, hold the muscle as tight as you can without increasing your pain. 4. Relax the muscles slowly and completely. Repeat __________ times. Complete this exercise __________ times a day. Straight leg raises This exercise stretches the muscles in front  of your thigh (quadriceps) and the muscles that move your hips (hip flexors). 1. Lie on your back with your left / right leg extended and your other knee bent. 2. Tense the muscles in the front of your left / right thigh. You should see your kneecap slide up or see increased dimpling just above the knee. Your thigh may even shake a bit. 3. Keep these muscles tight as you raise your leg 4-6 inches (10-15 cm) off the floor. Do not let your knee bend. 4. Hold this position for __________ seconds. 5. Keep these muscles tense as you lower your leg. 6. Relax your muscles slowly and completely after each repetition. Repeat __________ times. Complete this exercise __________ times a day. Hamstring, isometric 1. Lie on your back on a firm surface. 2. Bend your left / right knee about __________ degrees. 3. Dig your left / right heel into the surface as if you are trying to pull it toward your buttocks. Tighten the muscles in the back of your thighs (hamstring) to "dig" as hard as you can without increasing any pain. 4. Hold this position for __________ seconds. 5. Release the tension gradually and allow your muscles to relax completely for __________ seconds after each repetition. Repeat __________ times. Complete this exercise __________ times a day. Hamstring curls If told by your health care provider, do this exercise while wearing ankle weights. Begin with __________ lb weights. Then increase the weight by 1 lb (0.5 kg) increments. Do not wear ankle weights that are more than __________ lb. 1. Lie on your abdomen with your legs straight. 2. Bend your left / right knee as far as you can without feeling pain. Keep your hips flat against the floor. 3. Hold this position for __________ seconds. 4. Slowly lower your leg to the starting position. Repeat __________ times. Complete this exercise __________ times a day. Squats This exercise strengthens the muscles in front of your thigh and knee  (quadriceps). 1. Stand in front of a table, with your feet and knees pointing straight ahead. You may rest your hands on the table for balance but not for support. 2. Slowly bend your knees and lower your hips like you are going to sit in a chair. ? Keep your weight over your heels, not over your toes. ? Keep your lower legs upright so they are parallel with the table legs. ? Do not let your hips go lower than your knees. ? Do not bend lower than told by your health care provider. ? If your knee pain increases, do not bend as low. 3. Hold the squat position for __________ seconds. 4. Slowly push with your legs to return to standing. Do not use your hands to pull yourself to standing. Repeat __________ times. Complete this exercise __________ times a day. Wall slides This exercise strengthens the muscles in front of your thigh and knee (quadriceps). 1. Lean your back against a smooth wall or door, and walk your feet out 18-24 inches (46-61 cm) from it. 2. Place your feet hip-width apart. 3.   Slowly slide down the wall or door until your knees bend __________ degrees. Keep your knees over your heels, not over your toes. Keep your knees in line with your hips. 4. Hold this position for __________ seconds. Repeat __________ times. Complete this exercise __________ times a day. Straight leg raises This exercise strengthens the muscles that rotate the leg at the hip and move it away from your body (hip abductors). 1. Lie on your side with your left / right leg in the top position. Lie so your head, shoulder, knee, and hip line up. You may bend your bottom knee to help you keep your balance. 2. Roll your hips slightly forward so your hips are stacked directly over each other and your left / right knee is facing forward. 3. Leading with your heel, lift your top leg 4-6 inches (10-15 cm). You should feel the muscles in your outer hip lifting. ? Do not let your foot drift forward. ? Do not let your knee  roll toward the ceiling. 4. Hold this position for __________ seconds. 5. Slowly return your leg to the starting position. 6. Let your muscles relax completely after each repetition. Repeat __________ times. Complete this exercise __________ times a day. Straight leg raises This exercise stretches the muscles that move your hips away from the front of the pelvis (hip extensors). 1. Lie on your abdomen on a firm surface. You can put a pillow under your hips if that is more comfortable. 2. Tense the muscles in your buttocks and lift your left / right leg about 4-6 inches (10-15 cm). Keep your knee straight as you lift your leg. 3. Hold this position for __________ seconds. 4. Slowly lower your leg to the starting position. 5. Let your leg relax completely after each repetition. Repeat __________ times. Complete this exercise __________ times a day. This information is not intended to replace advice given to you by your health care provider. Make sure you discuss any questions you have with your health care provider. Document Revised: 01/29/2018 Document Reviewed: 01/29/2018 [employer]vier Patient Education  2020 [employer]  

## 2019-07-29 NOTE — Progress Notes (Signed)
Office Visit Note  Patient: Victoria Watson             Date of Birth: 1950/07/17           MRN: [id number]             PCP: Maurice Small, MD Referring: Maurice Small, MD Visit Date: 07/29/2019 Occupation: @GUAROCC @  Subjective:  Pain in both knee joints   History of Present Illness: BEULA JOYNER is a 69 y.o. female with history of myofascial pain syndrome and osteoarthritis.  She presents today with increased pain in both knee joints, which started 3-4 weeks ago.  She states she recently went to an aerobics class, which was outside on the concrete, which exacerbated her knee joint pain.  She denies any joint swelling.  She experiencing intermittent sharp pains in the left knee joint. She denies any mechanical symptoms.  She has chronic neck pain and stiffness. She has been performing neck exercises daily.  She has an appointment to receive the [facility] covid-19 vaccine on 08/02/19.    Activities of Daily Living:  Patient reports morning stiffness for 8 hours.   Patient Reports nocturnal pain.  Difficulty dressing/grooming: Reports Difficulty climbing stairs: Denies Difficulty getting out of chair: Denies Difficulty using hands for taps, buttons, cutlery, and/or writing: Reports  Review of Systems  Constitutional: Positive for fatigue.  HENT: Negative for mouth sores, mouth dryness and nose dryness.   Eyes: Positive for dryness. Negative for pain and visual disturbance.  Respiratory: Negative for cough, hemoptysis, shortness of breath and difficulty breathing.   Cardiovascular: Negative for chest pain, palpitations, hypertension and swelling in legs/feet.  Gastrointestinal: Negative for blood in stool, constipation and diarrhea.  Endocrine: Negative for increased urination.  Genitourinary: Negative for difficulty urinating and painful urination.  Musculoskeletal: Positive for arthralgias, joint pain, muscle weakness, morning stiffness and muscle tenderness.  Negative for joint swelling, myalgias and myalgias.  Skin: Negative for color change, pallor, rash, hair loss, nodules/bumps, skin tightness, ulcers and sensitivity to sunlight.  Allergic/Immunologic: Negative for susceptible to infections.  Neurological: Negative for dizziness, numbness, headaches and weakness.  Hematological: Negative for bruising/bleeding tendency and swollen glands.  Psychiatric/Behavioral: Positive for sleep disturbance. Negative for depressed mood. The patient is not nervous/anxious.     PMFS History:  Patient Active Problem List   Diagnosis Date Noted  . Chronic rhinitis 10/28/2018  . Hypermobility of joint 11/01/2016  . DDD (degenerative disc disease), thoracic 11/01/2016  . Osteopenia of multiple sites 11/01/2016  . Positive PPD treated with INH  11/01/2016  . Rheumatoid factor positive -CCP  11/01/2016  . Myofacial muscle pain 03/28/2016  . Insomnia 03/28/2016  . Other fatigue 03/28/2016  . DDD (degenerative disc disease), lumbar 03/28/2016  . DDD (degenerative disc disease), cervical 03/28/2016  . Primary osteoarthritis of both hands 03/28/2016  . Bronchiectasis (HCC) 07/09/2015  . Wheezing 06/16/2015  . Chest crackles 06/16/2015    Past Medical History:  Diagnosis Date  . Asthma   . Fibromyalgia   . Insomnia   . OA (osteoarthritis)   . Positive PPD    Took INH tabs x 3 months    Family History  Problem Relation Age of Onset  . Heart disease Father   . COPD Father   . Heart disease Paternal Grandmother   . Heart disease Paternal Grandfather    Past Surgical History:  Procedure Laterality Date  . TONSILLECTOMY    . tooth implant Left   . TOTAL ABDOMINAL  HYSTERECTOMY  1990   Social History   Social History Narrative  . Not on file   Immunization History  Administered Date(s) Administered  . Influenza,inj,Quad PF,6+ Mos 12/22/2014  . Tdap 09/03/2009     Objective: Vital Signs: BP 132/67 (BP Location: Left Arm, Patient Position:  Sitting, Cuff Size: Normal)   Pulse 80   Resp 14   Ht 4' 10.5" (1.486 m)   Wt 101 lb 12.8 oz (46.2 kg)   BMI 20.91 kg/m    Physical Exam Vitals and nursing note reviewed.  Constitutional:      Appearance: She is well-developed.  HENT:     Head: Normocephalic and atraumatic.  Eyes:     Conjunctiva/sclera: Conjunctivae normal.  Pulmonary:     Effort: Pulmonary effort is normal.  Abdominal:     General: Bowel sounds are normal.     Palpations: Abdomen is soft.  Musculoskeletal:     Cervical back: Normal range of motion.  Lymphadenopathy:     Cervical: No cervical adenopathy.  Skin:    General: Skin is warm and dry.     Capillary Refill: Capillary refill takes less than 2 seconds.  Neurological:     Mental Status: She is alert and oriented to person, place, and time.  Psychiatric:        Behavior: Behavior normal.      Musculoskeletal Exam: C-spine, thoracic spine, and lumbar spine good ROM.  No midline spinal tenderness.  No SI joint tenderness.  Shoulder joints, elbow joints, wrist joints, MCPs, PIPs, and DIPs good ROM with no synovitis. PIP and DIP thickening consistent with osteoarthritis of both hands. Complete fist formation bilaterally.Left hip joint painful ROM. Tenderness over the left trochanteric bursa. Knee joints, ankle joints, MTPs, PIPs, and DIPs good ROM with no synovitis.  No warmth or effusion of knee joints noted. Bilateral knee crepitus noted. No tenderness or inflammation of ankle joints.   CDAI Exam: CDAI Score: -- Patient Global: --; Provider Global: -- Swollen: --; Tender: -- Joint Exam 07/29/2019   No joint exam has been documented for this visit   There is currently no information documented on the homunculus. Go to the Rheumatology activity and complete the homunculus joint exam.  Investigation: No additional findings.  Imaging: XR HIP UNILAT W OR W/O PELVIS 2-3 VIEWS LEFT  Result Date: 07/29/2019 No hip joint narrowing was noted.  No  chondrocalcinosis was noted.  No SI joint narrowing or sclerosis was noted. Impression: Unremarkable x-ray of the hip joint.  XR KNEE 3 VIEW LEFT  Result Date: 07/29/2019 Mild medial compartment narrowing was noted.  Moderate patellofemoral narrowing was noted.  No chondrocalcinosis was noted. Impression: These findings are consistent with mild osteoarthritis and moderate chondromalacia patella.  XR KNEE 3 VIEW RIGHT  Result Date: 07/29/2019 No medial lateral compartment narrowing was noted.  No chondrocalcinosis was noted.  Severe patellofemoral narrowing was noted. Impression: These findings are consistent with severe chondromalacia patella.   Recent Labs: Lab Results  Component Value Date   WBC 6.8 04/04/2019   HGB 12.7 04/04/2019   PLT 287 04/04/2019   NA 139 04/04/2019   K 4.8 04/04/2019   CL 103 04/04/2019   CO2 26 04/04/2019   GLUCOSE 87 04/04/2019   BUN 13 04/04/2019   CREATININE 0.87 04/04/2019   BILITOT 0.4 04/04/2019   ALKPHOS 62 09/27/2016   AST 25 04/04/2019   ALT 20 04/04/2019   PROT 6.9 04/04/2019   ALBUMIN 4.0 09/27/2016   CALCIUM 9.5  04/04/2019   GFRAA 80 04/04/2019    Speciality Comments: Narcotic Agreement 02/15/17  Procedures:  No procedures performed Allergies: Patient has no known allergies.   Assessment / Plan:     Visit Diagnoses: Myofascial pain syndrome -Her myofascial pain has been tolerable recently. She continues to take tramadol 50 mg 1 tablet by mouth twice daily as needed for pain relief. She has chronic fatigue which has been stable. She has been sleeping well at night taking eszopiclone 3 mg po at bedtime. She has been exercising on a regular basis. We discussed the importance of regular exercise and good sleep hygiene. She'll follow-up in the office in 6 months.  Other insomnia: She takes eszopiclone 3 mg 1 tablet at bedtime for insomnia.   Other fatigue: She has chronic fatigue secondary to insomnia.  She exercises on a regular basis.    Primary osteoarthritis of both hands: She has PIP and DIP thickening consistent with osteoarthritis of both hands.  She has Weston joint prominence bilaterally.  No tenderness or synovitis was noted. She has complete fist formation bilaterally. She has no discomfort at this time. Joint protection and muscle strengthening were discussed.  DDD (degenerative disc disease), cervical: She has good range of motion on exam. She has intermittent discomfort in her C-spine. She has no symptoms of radiculopathy currently. She has been performing neck exercises daily.  DDD (degenerative disc disease), thoracic: No midline spinal tenderness.  DDD (degenerative disc disease), lumbar: She has good range of motion with no discomfort. No midline spinal tenderness. No symptoms of radiculopathy.  Rheumatoid factor positive -CCP: She has no synovitis on exam. No clinical features of rheumatoid arthritis.  Chronic pain of both knees She presents today with increased pain in both knee joints. She has good range of motion with no discomfort. No warmth or effusion was noted. X-rays of both knee joints were obtained today. X-rays of the left knee reveals mild osteoarthritic changes as well as moderate chondromalacia patella. Right knee has severe chondromalacia patella. She was given a handout of knee joint exercises to perform. She was advised to notify us if her discomfort persists or worsens. She was advised to use Voltaren gel topically as needed for pain relief. She will notify us if she needs a refill of Voltaren gel. Plan: XR KNEE 3 VIEW LEFT, XR KNEE 3 VIEW RIGHT  Pain in left hip -She has painful but full range of motion of the left hip joint on exam. Tenderness over the left trochanteric bursa was noted. X-rays of the left hip were obtained today which were unremarkable. Her discomfort is likely due to gait changes favoring her knee joint pain. Plan: XR HIP UNILAT W OR W/O PELVIS 2-3 VIEWS LEFT  Osteopenia of  multiple sites: She is taking a calcium and vitamin D supplement.  Other medical conditions are listed as follows:  Positive PPD treated with INH     Orders: Orders Placed This Encounter  Procedures  . XR KNEE 3 VIEW LEFT  . XR KNEE 3 VIEW RIGHT  . XR HIP UNILAT W OR W/O PELVIS 2-3 VIEWS LEFT   No orders of the defined types were placed in this encounter.   Face-to-face time spent with patient was 30 minutes. Greater than 50% of time was spent in counseling and coordination of care.  Follow-Up Instructions: Return in about 6 months (around 01/28/2020) for Myofascial pain syndrome, Osteoarthritis.   Ofilia Neas, PA-C  Note - This record has been created using Dragon  software.  Chart creation errors have been sought, but may not always  have been located. Such creation errors do not reflect on  the standard of medical care.

## 2019-08-02 ENCOUNTER — Other Ambulatory Visit (HOSPITAL_COMMUNITY): Payer: Medicare HMO

## 2019-08-02 DIAGNOSIS — Z23 Encounter for immunization: Secondary | ICD-10-CM | POA: Diagnosis not present

## 2019-08-06 DIAGNOSIS — H52209 Unspecified astigmatism, unspecified eye: Secondary | ICD-10-CM | POA: Diagnosis not present

## 2019-08-06 DIAGNOSIS — H5203 Hypermetropia, bilateral: Secondary | ICD-10-CM | POA: Diagnosis not present

## 2019-08-06 DIAGNOSIS — H524 Presbyopia: Secondary | ICD-10-CM | POA: Diagnosis not present

## 2019-08-09 ENCOUNTER — Other Ambulatory Visit (HOSPITAL_COMMUNITY)
Admission: RE | Admit: 2019-08-09 | Discharge: 2019-08-09 | Disposition: A | Discharge status: Home / Self Care | Payer: Medicare HMO | Source: Ambulatory Visit | Attending: Adult Health | Admitting: Adult Health

## 2019-08-09 DIAGNOSIS — Z20822 Contact with and (suspected) exposure to covid-19: Secondary | ICD-10-CM | POA: Diagnosis not present

## 2019-08-09 DIAGNOSIS — Z01812 Encounter for preprocedural laboratory examination: Secondary | ICD-10-CM | POA: Insufficient documentation

## 2019-08-09 LAB — SARS CORONAVIRUS 2 (TAT 6-24 HRS): SARS Coronavirus 2: NEGATIVE

## 2019-08-12 ENCOUNTER — Other Ambulatory Visit: Payer: Self-pay

## 2019-08-12 ENCOUNTER — Ambulatory Visit (INDEPENDENT_AMBULATORY_CARE_PROVIDER_SITE_OTHER): Payer: Medicare HMO | Admitting: Adult Health

## 2019-08-12 ENCOUNTER — Encounter: Payer: Self-pay | Admitting: Adult Health

## 2019-08-12 ENCOUNTER — Ambulatory Visit (INDEPENDENT_AMBULATORY_CARE_PROVIDER_SITE_OTHER): Payer: Medicare HMO | Admitting: Internal Medicine

## 2019-08-12 DIAGNOSIS — J479 Bronchiectasis, uncomplicated: Secondary | ICD-10-CM

## 2019-08-12 DIAGNOSIS — J4520 Mild intermittent asthma, uncomplicated: Secondary | ICD-10-CM | POA: Diagnosis not present

## 2019-08-12 LAB — PULMONARY FUNCTION TEST
DL/VA % pred: 132 %
DL/VA: 5.74 ml/min/mmHg/L
DLCO cor % pred: 115 %
DLCO cor: 18.7 ml/min/mmHg
DLCO unc % pred: 115 %
DLCO unc: 18.7 ml/min/mmHg
FEF 25-75 Post: 2.04 L/sec
FEF 25-75 Pre: 1.58 L/sec
FEF2575-%Change-Post: 29 %
FEF2575-%Pred-Post: 114 %
FEF2575-%Pred-Pre: 88 %
FEV1-%Change-Post: 5 %
FEV1-%Pred-Post: 90 %
FEV1-%Pred-Pre: 86 %
FEV1-Post: 1.72 L
FEV1-Pre: 1.63 L
FEV1FVC-%Change-Post: 3 %
FEV1FVC-%Pred-Pre: 103 %
FEV6-%Change-Post: 1 %
FEV6-%Pred-Post: 87 %
FEV6-%Pred-Pre: 86 %
FEV6-Post: 2.08 L
FEV6-Pre: 2.05 L
FEV6FVC-%Pred-Post: 104 %
FEV6FVC-%Pred-Pre: 104 %
FVC-%Change-Post: 1 %
FVC-%Pred-Post: 83 %
FVC-%Pred-Pre: 82 %
FVC-Post: 2.08 L
FVC-Pre: 2.05 L
Post FEV1/FVC ratio: 82 %
Post FEV6/FVC ratio: 100 %
Pre FEV1/FVC ratio: 80 %
Pre FEV6/FVC Ratio: 100 %
RV % pred: 112 %
RV: 2.06 L
TLC % pred: 99 %
TLC: 4.2 L

## 2019-08-12 NOTE — Progress Notes (Signed)
PFT done today. 

## 2019-08-12 NOTE — Progress Notes (Signed)
@Patient  ID: , female    DOB: 09/29/1950, 69 y.o.   MRN: [id number]  Chief Complaint  Patient presents with  . Follow-up    Referring provider: [id number], MD  HPI: 69 year old female never smoker seen for pulmonary consult February 2017 for wheezing found to have some mild bronchiectasis on CT scan   TEST/EVENTS :  HRCT chest that was neg for ILD. Mild Bronchiectasis/RML scarring. And extensive air trapping throughout lungs indicating small airways dz., 2 incidental LLL nodules 4 mm .   Methacholine challenge test was normal.   08/13/2019 Follow up : Bronchiectasis Patient returns for a follow-up visit.  She was last seen July 2020.  At last visit she was started on Symbicort for suspected component of possible asthma.  Previous methacholine challenge test was normal.  High-resolution CT chest showed some mild bronchiectasis with the right middle lobe scarring.  Patient had pulmonary function testing today that showed normal lung function with no airflow restriction or obstruction.  FEV1 90%, ratio 82, FVC 83%, no significant bronchodilator response.,  Positive mid flow bronchodilator response.  DLCO is normal.  Patient says she is very active.  Does have some mild shortness of breath with heavy activity.  Does not feel like it limits her activities.  She has rare use of albuterol.  Does feel that Symbicort has really helped with her breathing.  She denies any increased cough congestion.  No recent antibiotics or steroid use.  Patient has received her August 2020 vaccine.  She did have COVID-19 in January 2021.  Says it was a mild case.  Feels that she is recovered completely    No Known Allergies  Immunization History  Administered Date(s) Administered  . Influenza,inj,Quad PF,6+ Mos 12/22/2014  . Influenza-Unspecified 02/15/2019  . Janssen (J&J) SARS-COV-2 Vaccination 08/02/2019  . Tdap 09/03/2009  . Zoster 07/17/2018  . Zoster Recombinat (Shingrix)  05/07/2018, 09/27/2018    Past Medical History:  Diagnosis Date  . Asthma   . Fibromyalgia   . Insomnia   . OA (osteoarthritis)   . Positive PPD    Took INH tabs x 3 months    Tobacco History: Social History   Tobacco Use  Smoking Status Never Smoker  Smokeless Tobacco Never Used   Counseling given: Not Answered   Outpatient Medications Prior to Visit  Medication Sig Dispense Refill  . albuterol (VENTOLIN HFA) 108 (90 Base) MCG/ACT inhaler Inhale 2 puffs into the lungs every 6 (six) hours as needed for wheezing or shortness of breath. Insurance preference 18 g 2  . Beta Carotene (VITAMIN A) 25000 UNIT capsule Take 25,000 Units by mouth daily.    . Biotin 5 MG TABS Take by mouth daily.    . budesonide-formoterol (SYMBICORT) 80-4.5 MCG/ACT inhaler Inhale 2 puffs into the lungs 2 (two) times daily. OVERDUE FOR APPT 1 Inhaler 2  . Calcium Carbonate-Vitamin D (CALCIUM-VITAMIN D) 500-200 MG-UNIT tablet Take 1 tablet by mouth. 2 TO 3 TIMES WEEKLY    . Cholecalciferol (VITAMIN D PO) Take 1 tablet by mouth daily.    . cycloSPORINE (RESTASIS) 0.05 % ophthalmic emulsion Place 1 drop into both eyes 2 (two) times daily.    . diclofenac sodium (VOLTAREN) 1 % GEL Apply 3 grams to 3 large joints up to 3 times daily 3 Tube 3  . estradiol (ESTRACE) 1 MG tablet Take 1 mg by mouth daily.    . Eszopiclone 3 MG TABS TAKE ONE TABLET BY MOUTH EVERY NIGHT IMMEDIATELY  BEFORE BEDTIME 30 tablet 1  . Fluticasone Propionate (FLONASE ALLERGY RELIEF NA) Place into the nose.    . Magnesium 250 MG TABS Take by mouth every other day.     . Multiple Vitamins-Minerals (MULTIVITAMIN WITH MINERALS) tablet Take 1 tablet by mouth daily.    Marland Kitchen Respiratory Therapy Supplies (FLUTTER) DEVI Use as directed 1 each 0  . traMADol (ULTRAM) 50 MG tablet Take 1 tablet (50 mg total) by mouth 2 (two) times daily as needed. 60 tablet 0  . VIRT-GARD 2.2-25-1 MG TABS TAKE 1 TABLET BY MOUTH DAILY 90 each 4  . vitamin C (ASCORBIC  ACID) 500 MG tablet Take 500 mg by mouth daily.     No facility-administered medications prior to visit.     Review of Systems:   Constitutional:   No  weight loss, night sweats,  Fevers, chills, fatigue, or  lassitude.  HEENT:   No headaches,  Difficulty swallowing,  Tooth/dental problems, or  Sore throat,                No sneezing, itching, ear ache, nasal congestion, post nasal drip,   CV:  No chest pain,  Orthopnea, PND, swelling in lower extremities, anasarca, dizziness, palpitations, syncope.   GI  No heartburn, indigestion, abdominal pain, nausea, vomiting, diarrhea, change in bowel habits, loss of appetite, bloody stools.   Resp:    No excess mucus, no productive cough,  No non-productive cough,  No coughing up of blood.  No change in color of mucus.  No wheezing.  No chest wall deformity  Skin: no rash or lesions.  GU: no dysuria, change in color of urine, no urgency or frequency.  No flank pain, no hematuria   MS:  No joint pain or swelling.  No decreased range of motion.  No back pain.    Physical Exam  BP (!) 150/60 (BP Location: Left Arm, Cuff Size: Normal)   Pulse (!) 111   Temp 98 F (36.7 C) (Temporal)   Ht 4\' 10"  (1.473 m)   Wt 99 lb 6.4 oz (45.1 kg)   SpO2 99%   BMI 20.77 kg/m   GEN: A/Ox3; pleasant , NAD, well nourished    HEENT:  [place]/AT,  EACs-clear, TMs-wnl, NOSE-clear, THROAT-clear, no lesions, no postnasal drip or exudate noted.   NECK:  Supple w/ fair ROM; no JVD; normal carotid impulses w/o bruits; no thyromegaly or nodules palpated; no lymphadenopathy.    RESP  Clear  P & A; w/o, wheezes/ rales/ or rhonchi. no accessory muscle use, no dullness to percussion  CARD:  RRR, no m/r/g, no peripheral edema, pulses intact, no cyanosis or clubbing.  GI:   Soft & nt; nml bowel sounds; no organomegaly or masses detected.   Musco: Warm bil, no deformities or joint swelling noted.   Neuro: alert, no focal deficits noted.    Skin: Warm, no lesions or  rashes    Lab Results:  CBC    Component Value Date/Time   WBC 6.8 04/04/2019 0950   RBC 4.20 04/04/2019 0950   HGB 12.7 04/04/2019 0950   HCT 38.3 04/04/2019 0950   PLT 287 04/04/2019 0950   MCV 91.2 04/04/2019 0950   MCH 30.2 04/04/2019 0950   MCHC 33.2 04/04/2019 0950   RDW 12.7 04/04/2019 0950   LYMPHSABS 2,625 04/04/2019 0950   MONOABS 432 09/27/2016 1540   EOSABS 88 04/04/2019 0950   BASOSABS 82 04/04/2019 0950    BMET    Component Value  Date/Time   NA 139 04/04/2019 0950   K 4.8 04/04/2019 0950   CL 103 04/04/2019 0950   CO2 26 04/04/2019 0950   GLUCOSE 87 04/04/2019 0950   BUN 13 04/04/2019 0950   CREATININE 0.87 04/04/2019 0950   CALCIUM 9.5 04/04/2019 0950   GFRNONAA 69 04/04/2019 0950   GFRAA 80 04/04/2019 0950    BNP No results found for: BNP  ProBNP    PFT Results Latest Ref Rng & Units 08/12/2019 06/29/2015 06/28/2015  FVC-Pre L 2.05 2.33 2.24  FVC-Predicted Pre % 82 90 86  FVC-Post L 2.08 2.17 2.27  FVC-Predicted Post % 83 83 87  Pre FEV1/FVC % % 80 78 82  Post FEV1/FCV % % 82 83 80  FEV1-Pre L 1.63 1.82 1.83  FEV1-Predicted Pre % 86 91 92  FEV1-Post L 1.72 1.80 1.83  DLCO UNC% % 115 108 -  DLCO COR %Predicted % 132 132 -  TLC L 4.20 4.07 -  TLC % Predicted % 99 96 -  RV % Predicted % 112 98 -    Lab Results  Component Value Date   NITRICOXIDE 15 06/16/2015        Assessment & Plan:   Bronchiectasis (HCC) Appears stable.  Pulmonary function testing looks good.  Would continue with pulmonary hygiene regimen  Plan  Patient Instructions  Symbicort 80 2 puffs Twice daily  , rinse after use.  Albuterol Inhaler 2 puffs every 4hr as needed Flutter valve  Twice daily  As needed   Follow up with Dr. Marchelle Gearing or Marlinda Miranda NP in 6 months Please contact office for sooner follow up if symptoms do not improve or worsen or seek emergency care       Asthma Mild intermittent asthma.  Patient has improved symptom control on Symbicort.   We will continue for now.  Her pulmonary function testing looks very good.  On return to consider de-escalating her therapy.  Would continue with her pulmonary hygiene regimen  Plan  Patient Instructions  Symbicort 80 2 puffs Twice daily  , rinse after use.  Albuterol Inhaler 2 puffs every 4hr as needed Flutter valve  Twice daily  As needed   Follow up with Dr. Marchelle Gearing or Floreen Teegarden NP in 6 months Please contact office for sooner follow up if symptoms do not improve or worsen or seek emergency care          Rubye Oaks, NP 08/13/2019

## 2019-08-12 NOTE — Patient Instructions (Signed)
Symbicort 80 2 puffs Twice daily  , rinse after use.  Albuterol Inhaler 2 puffs every 4hr as needed Flutter valve  Twice daily  As needed   Follow up with Dr. Marchelle Gearing or Frady Taddeo NP in 6 months Please contact office for sooner follow up if symptoms do not improve or worsen or seek emergency care

## 2019-08-13 DIAGNOSIS — J45909 Unspecified asthma, uncomplicated: Secondary | ICD-10-CM | POA: Insufficient documentation

## 2019-08-13 NOTE — Assessment & Plan Note (Signed)
Appears stable.  Pulmonary function testing looks good.  Would continue with pulmonary hygiene regimen  Plan  Patient Instructions  Symbicort 80 2 puffs Twice daily  , rinse after use.  Albuterol Inhaler 2 puffs every 4hr as needed Flutter valve  Twice daily  As needed   Follow up with Dr. Marchelle Gearing or Quadre Bristol NP in 6 months Please contact office for sooner follow up if symptoms do not improve or worsen or seek emergency care

## 2019-08-13 NOTE — Assessment & Plan Note (Signed)
Mild intermittent asthma.  Patient has improved symptom control on Symbicort.  We will continue for now.  Her pulmonary function testing looks very good.  On return to consider de-escalating her therapy.  Would continue with her pulmonary hygiene regimen  Plan  Patient Instructions  Symbicort 80 2 puffs Twice daily  , rinse after use.  Albuterol Inhaler 2 puffs every 4hr as needed Flutter valve  Twice daily  As needed   Follow up with Dr. Marchelle Gearing or Parrett NP in 6 months Please contact office for sooner follow up if symptoms do not improve or worsen or seek emergency care

## 2019-08-18 ENCOUNTER — Other Ambulatory Visit: Payer: Self-pay

## 2019-08-18 MED ORDER — TRAMADOL HCL 50 MG PO TABS: 50.0000 mg | ORAL_TABLET | Freq: Two times a day (BID) | ORAL | 0 refills | Status: DC | PRN

## 2019-08-18 NOTE — Telephone Encounter (Signed)
Refill request received via fax from [facility] on [address] for tramadol.   Last Visit: 07/29/2019 Next Visit: 01/28/2020 UDS: 04/04/2019  Narc Agreement: 10/08/2018   Last fill: 07/16/2019  Okay to refill tramadol?

## 2019-09-10 ENCOUNTER — Ambulatory Visit: Payer: Medicare HMO | Admitting: Rheumatology

## 2019-09-16 ENCOUNTER — Other Ambulatory Visit: Payer: Self-pay | Admitting: *Deleted

## 2019-09-16 MED ORDER — TRAMADOL HCL 50 MG PO TABS: 50.0000 mg | ORAL_TABLET | Freq: Two times a day (BID) | ORAL | 0 refills | Status: DC | PRN

## 2019-09-16 NOTE — Telephone Encounter (Signed)
Refill request received via fax  Last Visit: 07/29/2019 Next Visit: 01/28/2020 UDS: 04/04/2019  Narc Agreement: 10/08/2018   Last fill: 08/18/2019   Okay to refill tramadol?

## 2019-09-23 ENCOUNTER — Other Ambulatory Visit: Payer: Self-pay | Admitting: Physician Assistant

## 2019-09-23 NOTE — Telephone Encounter (Signed)
Last Visit: 07/29/2019 Next Visit: 01/28/2020  Last Fill: 07/24/2019  Okay to refill Eszopiclone?

## 2019-10-07 DIAGNOSIS — R69 Illness, unspecified: Secondary | ICD-10-CM | POA: Diagnosis not present

## 2019-10-14 ENCOUNTER — Telehealth: Payer: Self-pay | Admitting: Rheumatology

## 2019-10-14 NOTE — Telephone Encounter (Signed)
Patient called requesting prescription refill of Tramadol to be sent to [facility] at [address].  Patient states she is going out of town tomorrow afternoon and needs to pick up her prescription before she leaves.

## 2019-10-14 NOTE — Telephone Encounter (Signed)
Last Visit: 07/29/2019 Next Visit: 01/28/2020 UDS: 04/04/2019  Narc Agreement: 10/08/2018   Last fill: 09/16/2019  Patient is leaving out of town on 10/15/2019 and would like to pick up tomorrow  Okay to refill Tramadol?

## 2019-10-15 MED ORDER — TRAMADOL HCL 50 MG PO TABS: 50.0000 mg | ORAL_TABLET | Freq: Two times a day (BID) | ORAL | 0 refills | Status: DC | PRN

## 2019-10-22 ENCOUNTER — Other Ambulatory Visit: Payer: Self-pay | Admitting: Physician Assistant

## 2019-10-22 NOTE — Telephone Encounter (Signed)
Last Visit:07/29/2019 Next Visit:01/28/2020  Last Fill: 09/23/2019  Okay to refillEszopiclone?

## 2019-10-29 DIAGNOSIS — G4700 Insomnia, unspecified: Secondary | ICD-10-CM | POA: Diagnosis not present

## 2019-10-29 DIAGNOSIS — Z79891 Long term (current) use of opiate analgesic: Secondary | ICD-10-CM | POA: Diagnosis not present

## 2019-10-29 DIAGNOSIS — H04129 Dry eye syndrome of unspecified lacrimal gland: Secondary | ICD-10-CM | POA: Diagnosis not present

## 2019-10-29 DIAGNOSIS — Z79890 Hormone replacement therapy (postmenopausal): Secondary | ICD-10-CM | POA: Diagnosis not present

## 2019-10-29 DIAGNOSIS — Z7951 Long term (current) use of inhaled steroids: Secondary | ICD-10-CM | POA: Diagnosis not present

## 2019-10-29 DIAGNOSIS — M1990 Unspecified osteoarthritis, unspecified site: Secondary | ICD-10-CM | POA: Diagnosis not present

## 2019-10-29 DIAGNOSIS — J45909 Unspecified asthma, uncomplicated: Secondary | ICD-10-CM | POA: Diagnosis not present

## 2019-10-29 DIAGNOSIS — M797 Fibromyalgia: Secondary | ICD-10-CM | POA: Diagnosis not present

## 2019-10-29 DIAGNOSIS — R030 Elevated blood-pressure reading, without diagnosis of hypertension: Secondary | ICD-10-CM | POA: Diagnosis not present

## 2019-10-29 DIAGNOSIS — Z79899 Other long term (current) drug therapy: Secondary | ICD-10-CM | POA: Diagnosis not present

## 2019-11-03 ENCOUNTER — Other Ambulatory Visit: Payer: Self-pay

## 2019-11-03 DIAGNOSIS — R69 Illness, unspecified: Secondary | ICD-10-CM | POA: Diagnosis not present

## 2019-11-03 MED ORDER — VIRT-GARD 2.2-25-1 MG PO TABS: 1.0000 | ORAL_TABLET | Freq: Every day | ORAL | 0 refills | Status: DC

## 2019-11-03 NOTE — Telephone Encounter (Signed)
Refill request received via fax from [facility] on [address] for virt gard.   Last Visit: 07/29/2019 Next Visit: 01/28/2020    Okay to refill virt gard?

## 2019-11-14 ENCOUNTER — Other Ambulatory Visit: Payer: Self-pay | Admitting: Rheumatology

## 2019-11-14 ENCOUNTER — Other Ambulatory Visit: Payer: Self-pay | Admitting: *Deleted

## 2019-11-14 ENCOUNTER — Other Ambulatory Visit: Payer: Self-pay

## 2019-11-14 DIAGNOSIS — Z5181 Encounter for therapeutic drug level monitoring: Secondary | ICD-10-CM

## 2019-11-14 NOTE — Telephone Encounter (Signed)
Last Visit:07/29/2019 Next Visit:01/28/2020 UDS:04/04/2019 Narc Agreement:10/08/2018   Last fill: 10/15/2019  Patient advised she is due to update her UDS and narcotic agreement. Patient states she will try to come today to update.   Okay to refill Tramadol?

## 2019-11-16 LAB — DRUG MONITOR, PANEL 5, W/CONF, URINE
Amphetamines: NEGATIVE ng/mL (ref <500)
Barbiturates: NEGATIVE ng/mL (ref <300)
Benzodiazepines: NEGATIVE ng/mL (ref <100)
Cocaine Metabolite: NEGATIVE ng/mL (ref <150)
Creatinine: 33.3 mg/dL
Marijuana Metabolite: NEGATIVE ng/mL (ref <20)
Methadone Metabolite: NEGATIVE ng/mL (ref <100)
Opiates: NEGATIVE ng/mL (ref <100)
Oxidant: NEGATIVE ug/mL
Oxycodone: NEGATIVE ng/mL (ref <100)
pH: 5.4 (ref 4.5–9.0)

## 2019-11-16 LAB — DRUG MONITOR, TRAMADOL,QN, URINE
Desmethyltramadol: 1124 ng/mL — ABNORMAL HIGH (ref <100)
Tramadol: >10000 ng/mL — ABNORMAL HIGH (ref <100)

## 2019-11-16 LAB — DM TEMPLATE

## 2019-11-16 NOTE — Progress Notes (Signed)
UDS consistent with treatment.

## 2019-11-19 DIAGNOSIS — R69 Illness, unspecified: Secondary | ICD-10-CM | POA: Diagnosis not present

## 2019-11-20 ENCOUNTER — Other Ambulatory Visit: Payer: Self-pay | Admitting: Physician Assistant

## 2019-11-20 NOTE — Telephone Encounter (Signed)
Last Visit:07/29/2019 Next Visit:01/28/2020  Last Fill: 10/22/2019   Okay to refill Eszopiclone?

## 2019-11-25 ENCOUNTER — Other Ambulatory Visit: Payer: Self-pay | Admitting: Internal Medicine

## 2019-12-15 ENCOUNTER — Other Ambulatory Visit: Payer: Self-pay | Admitting: Physician Assistant

## 2019-12-15 DIAGNOSIS — M25562 Pain in left knee: Secondary | ICD-10-CM

## 2019-12-15 DIAGNOSIS — M7918 Myalgia, other site: Secondary | ICD-10-CM

## 2019-12-15 DIAGNOSIS — M5136 Other intervertebral disc degeneration, lumbar region: Secondary | ICD-10-CM

## 2019-12-15 DIAGNOSIS — M5134 Other intervertebral disc degeneration, thoracic region: Secondary | ICD-10-CM

## 2019-12-15 DIAGNOSIS — M5030 Other cervical disc degeneration, unspecified cervical region: Secondary | ICD-10-CM

## 2019-12-15 NOTE — Telephone Encounter (Signed)
Last Visit:07/29/2019 Next Visit:01/28/2020 UDS:11/14/2019 Narc Agreement:11/14/2019  Last fill: 11/14/2019  Okay to refill Tramadol?

## 2019-12-15 NOTE — Telephone Encounter (Signed)
I called patient and discussed tapering her tramadol further.  She states she would not be able to taper her tramadol any further.  She stated that tramadol helps to manage her pain.  I will refer her to pain management.  She is agreeable to it.  I will give her tramadol refill until she gets an appointment with the pain management.

## 2019-12-15 NOTE — Telephone Encounter (Signed)
Referral placed.

## 2019-12-22 ENCOUNTER — Telehealth: Payer: Self-pay | Admitting: Rheumatology

## 2019-12-22 MED ORDER — ESZOPICLONE 3 MG PO TABS: ORAL_TABLET | ORAL | 0 refills | Status: DC

## 2019-12-22 NOTE — Telephone Encounter (Signed)
Patient left a voicemail requesting prescription refill of Eszopiclone to be sent to [facility] at [facility].

## 2019-12-22 NOTE — Telephone Encounter (Signed)
Last Visit: 07/29/2019  Next Visit: 01/28/2020   Last Fill: 11/20/2019  Okay to refill Eszopiclone?

## 2019-12-24 ENCOUNTER — Encounter: Payer: Self-pay | Admitting: Physical Medicine and Rehabilitation

## 2019-12-30 ENCOUNTER — Telehealth: Payer: Self-pay | Admitting: Adult Health

## 2019-12-30 NOTE — Telephone Encounter (Signed)
Budesonide/Form 80/4.5 mcg (120 INH) not covered by insurance. Symbicort Aer, Advair Diskuaer, or Advair Hfaaer preferred, please advise.

## 2020-01-01 NOTE — Telephone Encounter (Signed)
Can change to Advair 115 2 puffs Twice daily  , please let patient know insurance is requiring this

## 2020-01-01 NOTE — Telephone Encounter (Signed)
Called and spoke with pt letting her know that insurance is now no longer covering symbicort 80. Stated to her that TP said we could change her to Advair if she was okay with this being done. Pt said that she will hold off at this time as she currently does not need a refill of inhaler. She said she wants to see how much the symbicort 80 will cost her as she uses [facility] at her pharmacy. Pt stated if cost was too high she would then call office for inhaler switch. Nothing further needed.

## 2020-01-14 NOTE — Progress Notes (Deleted)
Office Visit Note  Patient: Victoria Watson             Date of Birth: [birth year]           MRN: [id number]             PCP: Maurice Small, MD Referring: Maurice Small, MD Visit Date: 01/28/2020 Occupation: @GUAROCC @  Subjective:  No chief complaint on file.   History of Present Illness: Victoria Watson is a 69 y.o. female ***   Activities of Daily Living:  Patient reports morning stiffness for *** {minute/hour:19697}.   Patient {ACTIONS;DENIES/REPORTS:21021675::"Denies"} nocturnal pain.  Difficulty dressing/grooming: {ACTIONS;DENIES/REPORTS:21021675::"Denies"} Difficulty climbing stairs: {ACTIONS;DENIES/REPORTS:21021675::"Denies"} Difficulty getting out of chair: {ACTIONS;DENIES/REPORTS:21021675::"Denies"} Difficulty using hands for taps, buttons, cutlery, and/or writing: {ACTIONS;DENIES/REPORTS:21021675::"Denies"}  No Rheumatology ROS completed.   PMFS History:  Patient Active Problem List   Diagnosis Date Noted  . Asthma 08/13/2019  . Chronic rhinitis 10/28/2018  . Hypermobility of joint 11/01/2016  . DDD (degenerative disc disease), thoracic 11/01/2016  . Osteopenia of multiple sites 11/01/2016  . Positive PPD treated with INH  11/01/2016  . Rheumatoid factor positive -CCP  11/01/2016  . Myofacial muscle pain 03/28/2016  . Insomnia 03/28/2016  . Other fatigue 03/28/2016  . DDD (degenerative disc disease), lumbar 03/28/2016  . DDD (degenerative disc disease), cervical 03/28/2016  . Primary osteoarthritis of both hands 03/28/2016  . Bronchiectasis (HCC) 07/09/2015  . Wheezing 06/16/2015  . Chest crackles 06/16/2015    Past Medical History:  Diagnosis Date  . Asthma   . Fibromyalgia   . Insomnia   . OA (osteoarthritis)   . Positive PPD    Took INH tabs x 3 months    Family History  Problem Relation Age of Onset  . Heart disease Father   . COPD Father   . Heart disease Paternal Grandmother   . Heart disease Paternal Grandfather    Past  Surgical History:  Procedure Laterality Date  . TONSILLECTOMY    . tooth implant Left   . TOTAL ABDOMINAL HYSTERECTOMY  1990   Social History   Social History Narrative  . Not on file   Immunization History  Administered Date(s) Administered  . Influenza,inj,Quad PF,6+ Mos 12/22/2014  . Influenza-Unspecified 02/15/2019  . Janssen (J&J) SARS-COV-2 Vaccination 08/02/2019  . Tdap 09/03/2009  . Zoster 07/17/2018  . Zoster Recombinat (Shingrix) 05/07/2018, 09/27/2018     Objective: Vital Signs: There were no vitals taken for this visit.   Physical Exam   Musculoskeletal Exam: ***  CDAI Exam: CDAI Score: -- Patient Global: --; Provider Global: -- Swollen: --; Tender: -- Joint Exam 01/28/2020   No joint exam has been documented for this visit   There is currently no information documented on the homunculus. Go to the Rheumatology activity and complete the homunculus joint exam.  Investigation: No additional findings.  Imaging: No results found.  Recent Labs: Lab Results  Component Value Date   WBC 6.8 04/04/2019   HGB 12.7 04/04/2019   PLT 287 04/04/2019   NA 139 04/04/2019   K 4.8 04/04/2019   CL 103 04/04/2019   CO2 26 04/04/2019   GLUCOSE 87 04/04/2019   BUN 13 04/04/2019   CREATININE 0.87 04/04/2019   BILITOT 0.4 04/04/2019   ALKPHOS 62 09/27/2016   AST 25 04/04/2019   ALT 20 04/04/2019   PROT 6.9 04/04/2019   ALBUMIN 4.0 09/27/2016   CALCIUM 9.5 04/04/2019   GFRAA 80 04/04/2019    Speciality Comments: Narcotic Agreement 02/15/17  Procedures:  No procedures performed Allergies: Patient has no known allergies.   Assessment / Plan:     Visit Diagnoses: No diagnosis found.  Orders: No orders of the defined types were placed in this encounter.  No orders of the defined types were placed in this encounter.   Face-to-face time spent with patient was *** minutes. Greater than 50% of time was spent in counseling and coordination of  care.  Follow-Up Instructions: No follow-ups on file.   Ellen Henri, CMA  Note - This record has been created using Animal nutritionist.  Chart creation errors have been sought, but may not always  have been located. Such creation errors do not reflect on  the standard of medical care.

## 2020-01-20 ENCOUNTER — Other Ambulatory Visit: Payer: Self-pay | Admitting: Physician Assistant

## 2020-01-20 DIAGNOSIS — R69 Illness, unspecified: Secondary | ICD-10-CM | POA: Diagnosis not present

## 2020-01-20 NOTE — Telephone Encounter (Signed)
Last Visit:07/29/2019 Next Visit:01/28/2020  Last Fill: 12/22/2019  Okay to refill Eszopiclone?

## 2020-01-23 ENCOUNTER — Encounter: Payer: Medicare HMO | Attending: Physical Medicine and Rehabilitation | Admitting: Physical Medicine and Rehabilitation

## 2020-01-23 ENCOUNTER — Encounter: Payer: Self-pay | Admitting: Physical Medicine and Rehabilitation

## 2020-01-23 ENCOUNTER — Other Ambulatory Visit: Payer: Self-pay

## 2020-01-23 VITALS — BP 144/80 | HR 85 | Temp 98.2°F | Ht 58.5 in | Wt 99.4 lb

## 2020-01-23 DIAGNOSIS — M797 Fibromyalgia: Secondary | ICD-10-CM | POA: Insufficient documentation

## 2020-01-23 DIAGNOSIS — M47816 Spondylosis without myelopathy or radiculopathy, lumbar region: Secondary | ICD-10-CM | POA: Diagnosis not present

## 2020-01-23 DIAGNOSIS — M7918 Myalgia, other site: Secondary | ICD-10-CM | POA: Diagnosis not present

## 2020-01-23 MED ORDER — PREGABALIN 25 MG PO CAPS: 25.0000 mg | ORAL_CAPSULE | Freq: Every day | ORAL | 1 refills | Status: DC

## 2020-01-23 NOTE — Progress Notes (Signed)
Subjective:    Patient ID: Victoria Watson, female    DOB: 11-09-1950, 69 y.o.   MRN: [id number]  HPI  Victoria Watson is a 69 year old woman who presents to establish care for fibromyalgia.   She has seen Dr. Corliss Skains since 2009 and has been on Tramadol 50mg  BID since this time. She has been trying to take one per day since she was told in August that Dr. September office would no longer be able to prescribe. It helps her get up and down the steps, do laundry, and to play with her grandchildren.   Her husband took her to a holistic provider and she had told her not to try Lyrica so she has never tried this.   She went to anaerobic dance class in the spring. She mainly exercises with her grandchildren. She walks twice a week in the morning. Also enjoys dancing and playing with her grandchildren.  She sleeps poorly at night- difficulty falling asleep. She takes 11-13-1990.   Pain Inventory Average Pain 8 Pain Right Now 5 My pain is constant, dull, stabbing and aching  In the last 24 hours, has pain interfered with the following? General activity 0 Relation with others 0 Enjoyment of life 0 What TIME of day is your pain at its worst? evening and night Sleep (in general) Poor  Pain is worse with: bending Pain improves with: rest, heat/ice and medication Relief from Meds: 9  do you drive?  yes  retired I need assistance with the following:  shopping  weakness numbness  New pt  New pt    Family History  Problem Relation Age of Onset  . Heart disease Father   . COPD Father   . Heart disease Paternal Grandmother   . Heart disease Paternal Grandfather    Social History   Socioeconomic History  . Marital status: Married    Spouse name: Not on file  . Number of children: Not on file  . Years of education: Not on file  . Highest education level: Not on file  Occupational History  . Occupation: retired  Tobacco Use  . Smoking status: Never Smoker  . Smokeless  tobacco: Never Used  Vaping Use  . Vaping Use: Never used  Substance and Sexual Activity  . Alcohol use: No    Alcohol/week: 0.0 standard drinks  . Drug use: No  . Sexual activity: Not on file  Other Topics Concern  . Not on file  Social History Narrative  . Not on file   Social Determinants of Health   Financial Resource Strain:   . Difficulty of Paying Living Expenses: Not on file  Food Insecurity:   . Worried About [place] in the Last Year: Not on file  . Ran Out of Food in the Last Year: Not on file  Transportation Needs:   . Lack of Transportation (Medical): Not on file  . Lack of Transportation (Non-Medical): Not on file  Physical Activity:   . Days of Exercise per Week: Not on file  . Minutes of Exercise per Session: Not on file  Stress:   . Feeling of Stress : Not on file  Social Connections:   . Frequency of Communication with Friends and Family: Not on file  . Frequency of Social Gatherings with Friends and Family: Not on file  . Attends Religious Services: Not on file  . Active Member of Clubs or Organizations: Not on file  . Attends Programme researcher, broadcasting/film/video Meetings:  Not on file  . Marital Status: Not on file   Past Surgical History:  Procedure Laterality Date  . TONSILLECTOMY    . tooth implant Left   . TOTAL ABDOMINAL HYSTERECTOMY  1990   Past Medical History:  Diagnosis Date  . Asthma   . Fibromyalgia   . Insomnia   . OA (osteoarthritis)   . Positive PPD    Took INH tabs x 3 months   BP (!) 144/80   Pulse 85   Temp 98.2 F (36.8 C)   Ht 4' 10.5" (1.486 m)   Wt 99 lb 6.4 oz (45.1 kg)   SpO2 96%   BMI 20.42 kg/m   Opioid Risk Score:   Fall Risk Score:  `1  Depression screen PHQ 2/9  Depression screen PHQ 2/9 01/23/2020  Decreased Interest 1  Down, Depressed, Hopeless 0  PHQ - 2 Score 1  Altered sleeping 3  Tired, decreased energy 3  Change in appetite 0  Feeling bad or failure about yourself  0  Trouble concentrating 1    Moving slowly or fidgety/restless 1  Suicidal thoughts 0  PHQ-9 Score 9  Difficult doing work/chores Somewhat difficult    Review of Systems  Musculoskeletal: Positive for back pain and neck pain.  Neurological: Positive for weakness and numbness.  All other systems reviewed and are negative.      Objective:   Physical Exam Gen: no distress, normal appearing HEENT: oral mucosa pink and moist, NCAT Cardio: Reg rate Chest: normal effort, normal rate of breathing Abd: soft, non-distended Ext: no edema Skin: intact Neuro: Alert and oriented x3.  Musculoskeletal:Diffuse pai throughout the body.  Psych: pleasant, normal affect     Assessment & Plan:  Victoria Watson is a 69 year old woman who presents to establish care for fibromyalgia.   1) Fibromyalgia -trigger point injections -made goal to walk three times weekly in the morning- walks with neighbor Victoria Watson -start Lyrica 25mg  HS -Advised that Lyrica is better medication than Tramadol for fibromyalgia, but will prescribe Tramadol if it is helping her functionally. She will call or text when she needs a refill.  -Goal is to have improved energy and be able to play with her grandchildren, which she loves. -Recommended reading the Microbiome diet and following strict no sugar/no gluten/no eggs/no dairy diet for 3 weeks: can eat unlimited health fats (grass-fed butter, olive oil, olives, avocados), vegetables, fruits, fish, grass-fed meat.   2) Insomnia -She will let know is if she needs a refill of Lunesta -Advised regarding daily walk outside during sunrise to reset circadian rhythm. -Lyrica should also help with sleep- may no longer need Lunesta.  3) Lumbar facet arthropathy: -core and paraspinal strengthening -can continue voltaren gel -avoid extension based exercises.   All questions answered. RTC in 4 weeks

## 2020-01-23 NOTE — Patient Instructions (Signed)
Book: The Microbiome Diet

## 2020-01-28 ENCOUNTER — Ambulatory Visit: Payer: Medicare HMO | Admitting: Rheumatology

## 2020-02-02 DIAGNOSIS — Z23 Encounter for immunization: Secondary | ICD-10-CM | POA: Diagnosis not present

## 2020-02-04 ENCOUNTER — Telehealth: Payer: Self-pay | Admitting: *Deleted

## 2020-02-04 ENCOUNTER — Other Ambulatory Visit: Payer: Self-pay | Admitting: Physical Medicine and Rehabilitation

## 2020-02-04 MED ORDER — TRAMADOL HCL 50 MG PO TABS: 50.0000 mg | ORAL_TABLET | Freq: Two times a day (BID) | ORAL | 0 refills | Status: DC | PRN

## 2020-02-04 NOTE — Telephone Encounter (Signed)
Notified Mrs Hoh.

## 2020-02-04 NOTE — Telephone Encounter (Signed)
I have sent it for her!

## 2020-02-04 NOTE — Telephone Encounter (Addendum)
Victoria Watson reports that Dr Carlis Abbott told her to call when she needs more Tramadol. Per the PMP last fill was 12/15/19 #60.

## 2020-02-05 ENCOUNTER — Other Ambulatory Visit: Payer: Self-pay | Admitting: Physician Assistant

## 2020-02-05 NOTE — Telephone Encounter (Signed)
Last Visit:07/29/2019 Next Visit:due October. Patient had to cancel appointment due to mother being sick. Patient will call to reschedule.   Okay to refill per Dr. Corliss Skains

## 2020-02-11 DIAGNOSIS — L648 Other androgenic alopecia: Secondary | ICD-10-CM | POA: Diagnosis not present

## 2020-02-11 DIAGNOSIS — X32XXXA Exposure to sunlight, initial encounter: Secondary | ICD-10-CM | POA: Diagnosis not present

## 2020-02-11 DIAGNOSIS — L570 Actinic keratosis: Secondary | ICD-10-CM | POA: Diagnosis not present

## 2020-02-19 ENCOUNTER — Other Ambulatory Visit: Payer: Self-pay | Admitting: *Deleted

## 2020-02-19 MED ORDER — PREGABALIN 25 MG PO CAPS: 25.0000 mg | ORAL_CAPSULE | Freq: Every day | ORAL | 1 refills | Status: DC

## 2020-02-19 MED ORDER — ESZOPICLONE 3 MG PO TABS: ORAL_TABLET | ORAL | 0 refills | Status: DC

## 2020-02-19 NOTE — Telephone Encounter (Signed)
Patient left a message asking for her Lyrica and Lunesta refilled. She has an appointment tomorrow with Dr. Carlis Abbott.  She thinks she might run out prior to visit and is asking for refills

## 2020-02-20 ENCOUNTER — Encounter: Payer: Self-pay | Admitting: Physical Medicine and Rehabilitation

## 2020-02-20 ENCOUNTER — Other Ambulatory Visit: Payer: Self-pay

## 2020-02-20 ENCOUNTER — Encounter: Payer: Medicare HMO | Admitting: Physical Medicine and Rehabilitation

## 2020-02-20 VITALS — BP 147/77 | HR 93 | Temp 98.9°F | Ht 58.5 in | Wt 101.0 lb

## 2020-02-20 DIAGNOSIS — M797 Fibromyalgia: Secondary | ICD-10-CM | POA: Diagnosis not present

## 2020-02-20 DIAGNOSIS — M7918 Myalgia, other site: Secondary | ICD-10-CM | POA: Diagnosis not present

## 2020-02-20 DIAGNOSIS — M47816 Spondylosis without myelopathy or radiculopathy, lumbar region: Secondary | ICD-10-CM | POA: Diagnosis not present

## 2020-02-20 NOTE — Progress Notes (Signed)
Subjective:    Patient ID: Victoria Watson, female    DOB: 03/29/1951, 69 y.o.   MRN: [id number]  HPI  Victoria Watson is a 69 year old woman who presents to establish care for fibromyalgia.   Her pain has been better controlled with the Lyrica and she not needs as much as Tylenol.   Has been helping her sleep better at night. She takes her Lunesta at night as well.  Prior history:  She has seen Dr. Corliss Skains since 2009 and has been on Tramadol 50mg  BID since this time. She has been trying to take one per day since she was told in August that Dr. September office would no longer be able to prescribe. It helps her get up and down the steps, do laundry, and to play with her grandchildren.   Her husband took her to a holistic provider and she had told her not to try Lyrica so she has never tried this.   She went to anaerobic dance class in the spring. She mainly exercises with her grandchildren. She walks twice a week in the morning. Also enjoys dancing and playing with her grandchildren.  She sleeps poorly at night- difficulty falling asleep. She takes 11-13-1990.   Pain Inventory Average Pain 7 Pain Right Now 5 My pain is intermittent, sharp, dull, stabbing and aching  In the last 24 hours, has pain interfered with the following? General activity 6 Relation with others 2 Enjoyment of life 0 What TIME of day is your pain at its worst? morning  and evening Sleep (in general) fair - good  Pain is worse with: bending, sitting, standing and some activites Pain improves with: rest, medication and heat Relief from Meds: 8    Family History  Problem Relation Age of Onset  . Heart disease Father   . COPD Father   . Heart disease Paternal Grandmother   . Heart disease Paternal Grandfather    Social History   Socioeconomic History  . Marital status: Married    Spouse name: Not on file  . Number of children: Not on file  . Years of education: Not on file  . Highest education  level: Not on file  Occupational History  . Occupation: retired  Tobacco Use  . Smoking status: Never Smoker  . Smokeless tobacco: Never Used  Vaping Use  . Vaping Use: Never used  Substance and Sexual Activity  . Alcohol use: No    Alcohol/week: 0.0 standard drinks  . Drug use: No  . Sexual activity: Not on file  Other Topics Concern  . Not on file  Social History Narrative  . Not on file   Social Determinants of Health   Financial Resource Strain:   . Difficulty of Paying Living Expenses: Not on file  Food Insecurity:   . Worried About [place] in the Last Year: Not on file  . Ran Out of Food in the Last Year: Not on file  Transportation Needs:   . Lack of Transportation (Medical): Not on file  . Lack of Transportation (Non-Medical): Not on file  Physical Activity:   . Days of Exercise per Week: Not on file  . Minutes of Exercise per Session: Not on file  Stress:   . Feeling of Stress : Not on file  Social Connections:   . Frequency of Communication with Friends and Family: Not on file  . Frequency of Social Gatherings with Friends and Family: Not on file  . Attends  Religious Services: Not on file  . Active Member of Clubs or Organizations: Not on file  . Attends Banker Meetings: Not on file  . Marital Status: Not on file   Past Surgical History:  Procedure Laterality Date  . TONSILLECTOMY    . tooth implant Left   . TOTAL ABDOMINAL HYSTERECTOMY  1990   Past Medical History:  Diagnosis Date  . Asthma   . Fibromyalgia   . Insomnia   . OA (osteoarthritis)   . Positive PPD    Took INH tabs x 3 months   There were no vitals taken for this visit.  Opioid Risk Score:   Fall Risk Score:  `1  Depression screen PHQ 2/9  Depression screen PHQ 2/9 01/23/2020  Decreased Interest 1  Down, Depressed, Hopeless 0  PHQ - 2 Score 1  Altered sleeping 3  Tired, decreased energy 3  Change in appetite 0  Feeling bad or failure about yourself   0  Trouble concentrating 1  Moving slowly or fidgety/restless 1  Suicidal thoughts 0  PHQ-9 Score 9  Difficult doing work/chores Somewhat difficult    Review of Systems  Musculoskeletal: Positive for back pain and neck pain.       Left upper back pain  Neurological: Negative for weakness and numbness.  All other systems reviewed and are negative.      Objective:   Physical Exam Gen: no distress, normal appearing HEENT: oral mucosa pink and moist, NCAT Cardio: Reg rate Chest: normal effort, normal rate of breathing Abd: soft, non-distended Ext: no edema Skin: intact Neuro: Alert and oriented x3.  Musculoskeletal:Diffuse pai throughout the body.  Psych: pleasant, normal affect     Assessment & Plan:  Victoria Watson is a 69 year old woman who presents to establish care for fibromyalgia.   1) Fibromyalgia -trigger point injections -made goal to walk three times weekly in the morning- walks with neighbor Harriett Sine -Continue Lyrica 25mg  HS -Advised that Lyrica is better medication than Tramadol for fibromyalgia, but will prescribe Tramadol if it is helping her functionally. She will call or text when she needs a refill.  -Goal is to have improved energy and be able to play with her grandchildren, which she loves. -Recommended reading the Microbiome diet and following strict no sugar/no gluten/no eggs/no dairy diet for 3 weeks: can eat unlimited health fats (grass-fed butter, olive oil, olives, avocados), vegetables, fruits, fish, grass-fed meat.   2) Insomnia -She will let know is if she needs a refill of Lunesta -Advised regarding daily walk outside during sunrise to reset circadian rhythm. -Lyrica should also help with sleep- may no longer need Lunesta.  3) Lumbar facet arthropathy: -core and paraspinal strengthening -can continue voltaren gel, advised that she can try blue emu oil  -avoid extension based exercises.   All questions answered. RTC in 4 weeks

## 2020-02-20 NOTE — Patient Instructions (Signed)
Blue emu oil 

## 2020-03-04 ENCOUNTER — Other Ambulatory Visit: Payer: Self-pay | Admitting: Physical Medicine and Rehabilitation

## 2020-03-04 NOTE — Telephone Encounter (Signed)
Refill request for Tramadol 

## 2020-03-17 ENCOUNTER — Other Ambulatory Visit: Payer: Self-pay | Admitting: Physical Medicine and Rehabilitation

## 2020-04-07 ENCOUNTER — Encounter: Payer: Self-pay | Admitting: Physical Medicine and Rehabilitation

## 2020-04-07 ENCOUNTER — Encounter: Payer: Medicare HMO | Attending: Physical Medicine and Rehabilitation | Admitting: Physical Medicine and Rehabilitation

## 2020-04-07 ENCOUNTER — Other Ambulatory Visit: Payer: Self-pay

## 2020-04-07 VITALS — BP 151/78 | HR 94 | Temp 98.8°F | Ht 58.5 in | Wt 101.6 lb

## 2020-04-07 DIAGNOSIS — M7918 Myalgia, other site: Secondary | ICD-10-CM

## 2020-04-07 MED ORDER — PREGABALIN 25 MG PO CAPS: 25.0000 mg | ORAL_CAPSULE | Freq: Two times a day (BID) | ORAL | 1 refills | Status: DC

## 2020-04-07 NOTE — Patient Instructions (Signed)
Trigger Point Injection  Indication: Cervical myofascial pain not relieved by medication management and other conservative care.  Informed consent was obtained after describing risk and benefits of the procedure with the patient, this includes bleeding, bruising, infection and medication side effects.  The patient wishes to proceed and has given written consent.  The patient was placed in a seated position.  The cervical area was marked and prepped with Betadine.  It was entered with a 25-gauge 1/2 inch needle and a total of 5 mL of 1% lidocaine and normal saline was injected into a total of 4 trigger points, after negative draw back for blood.  The patient tolerated the procedure well.  Post procedure instructions were given. 

## 2020-04-15 ENCOUNTER — Other Ambulatory Visit: Payer: Self-pay | Admitting: Physical Medicine and Rehabilitation

## 2020-05-03 DIAGNOSIS — J309 Allergic rhinitis, unspecified: Secondary | ICD-10-CM | POA: Diagnosis not present

## 2020-05-03 DIAGNOSIS — J479 Bronchiectasis, uncomplicated: Secondary | ICD-10-CM | POA: Diagnosis not present

## 2020-05-03 DIAGNOSIS — M1990 Unspecified osteoarthritis, unspecified site: Secondary | ICD-10-CM | POA: Diagnosis not present

## 2020-05-03 DIAGNOSIS — K219 Gastro-esophageal reflux disease without esophagitis: Secondary | ICD-10-CM | POA: Diagnosis not present

## 2020-05-03 DIAGNOSIS — Z1389 Encounter for screening for other disorder: Secondary | ICD-10-CM | POA: Diagnosis not present

## 2020-05-03 DIAGNOSIS — M797 Fibromyalgia: Secondary | ICD-10-CM | POA: Diagnosis not present

## 2020-05-03 DIAGNOSIS — I868 Varicose veins of other specified sites: Secondary | ICD-10-CM | POA: Diagnosis not present

## 2020-05-03 DIAGNOSIS — Z0000 Encounter for general adult medical examination without abnormal findings: Secondary | ICD-10-CM | POA: Diagnosis not present

## 2020-05-05 ENCOUNTER — Other Ambulatory Visit: Payer: Self-pay | Admitting: Physical Medicine and Rehabilitation

## 2020-05-05 DIAGNOSIS — Z1211 Encounter for screening for malignant neoplasm of colon: Secondary | ICD-10-CM | POA: Diagnosis not present

## 2020-05-05 DIAGNOSIS — K5900 Constipation, unspecified: Secondary | ICD-10-CM | POA: Diagnosis not present

## 2020-05-05 DIAGNOSIS — Z1159 Encounter for screening for other viral diseases: Secondary | ICD-10-CM | POA: Diagnosis not present

## 2020-05-13 DIAGNOSIS — H524 Presbyopia: Secondary | ICD-10-CM | POA: Diagnosis not present

## 2020-06-07 ENCOUNTER — Other Ambulatory Visit: Payer: Self-pay | Admitting: Physical Medicine and Rehabilitation

## 2020-06-09 ENCOUNTER — Encounter: Payer: Medicare HMO | Attending: Physical Medicine and Rehabilitation | Admitting: Physical Medicine and Rehabilitation

## 2020-06-09 ENCOUNTER — Encounter: Payer: Self-pay | Admitting: Physical Medicine and Rehabilitation

## 2020-06-09 ENCOUNTER — Other Ambulatory Visit: Payer: Self-pay

## 2020-06-09 VITALS — BP 127/81 | HR 79 | Temp 98.7°F | Ht 58.5 in | Wt 103.0 lb

## 2020-06-09 DIAGNOSIS — M797 Fibromyalgia: Secondary | ICD-10-CM | POA: Insufficient documentation

## 2020-06-09 DIAGNOSIS — M7918 Myalgia, other site: Secondary | ICD-10-CM | POA: Diagnosis not present

## 2020-06-09 DIAGNOSIS — G894 Chronic pain syndrome: Secondary | ICD-10-CM | POA: Insufficient documentation

## 2020-06-09 DIAGNOSIS — Z79891 Long term (current) use of opiate analgesic: Secondary | ICD-10-CM | POA: Diagnosis not present

## 2020-06-09 DIAGNOSIS — M545 Low back pain, unspecified: Secondary | ICD-10-CM | POA: Diagnosis not present

## 2020-06-09 DIAGNOSIS — Z5181 Encounter for therapeutic drug level monitoring: Secondary | ICD-10-CM | POA: Insufficient documentation

## 2020-06-09 NOTE — Progress Notes (Signed)
Subjective:    Patient ID: Victoria Watson, female    DOB: Apr 18, 1951, 70 y.o.   MRN: [id number]  HPI  Victoria Watson is a 70 year old woman who presents to follow-up for fibromyalgia and low back pain.   1) Low back pain has worsened: -She has been having worsening low back pain recently -She feels it is from lifting her grandchildren  -She has asked them to come on her lap instead  2) Cervical myofascial pain: -She did not get relief from the trigger point injection last visit -She has a massage scheduled today.   3) Fibromyalgia: -She is currently taking Tramadol twice per day -Her pain has been better controlled with the Lyrica and she not needs as much as Tylenol.   4) Insomnia -Has been helping her sleep better at night. She takes her Lunesta at night as well.  Prior history:  She has seen Dr. Corliss Skains since 2009 and has been on Tramadol 50mg  BID since this time. She has been trying to take one per day since she was told in August that Dr. September office would no longer be able to prescribe. It helps her get up and down the steps, do laundry, and to play with her grandchildren.   Her husband took her to a holistic provider and she had told her not to try Lyrica so she has never tried this.   She went to anaerobic dance class in the spring. She mainly exercises with her grandchildren. She walks twice a week in the morning. Also enjoys dancing and playing with her grandchildren.  She sleeps poorly at night- difficulty falling asleep. She takes 11-13-1990.   Pain Inventory Average Pain 7 Pain Right Now 6 My pain is intermittent, sharp, burning, dull and aching  In the last 24 hours, has pain interfered with the following? General activity 4 Relation with others 0 Enjoyment of life 0 What TIME of day is your pain at its worst? daytime and evening Sleep (in general) Fair  Pain is worse with: bending and some activites Pain improves with: rest, heat/ice,  therapy/exercise, medication and heat Relief from Meds: 6    Family History  Problem Relation Age of Onset  . Heart disease Father   . COPD Father   . Heart disease Paternal Grandmother   . Heart disease Paternal Grandfather    Social History   Socioeconomic History  . Marital status: Married    Spouse name: Not on file  . Number of children: Not on file  . Years of education: Not on file  . Highest education level: Not on file  Occupational History  . Occupation: retired  Tobacco Use  . Smoking status: Never Smoker  . Smokeless tobacco: Never Used  Vaping Use  . Vaping Use: Never used  Substance and Sexual Activity  . Alcohol use: No    Alcohol/week: 0.0 standard drinks  . Drug use: No  . Sexual activity: Not on file  Other Topics Concern  . Not on file  Social History Narrative  . Not on file   Social Determinants of Health   Financial Resource Strain: Not on file  Food Insecurity: Not on file  Transportation Needs: Not on file  Physical Activity: Not on file  Stress: Not on file  Social Connections: Not on file   Past Surgical History:  Procedure Laterality Date  . TONSILLECTOMY    . tooth implant Left   . TOTAL ABDOMINAL HYSTERECTOMY  1990   Past  Medical History:  Diagnosis Date  . Asthma   . Fibromyalgia   . Insomnia   . OA (osteoarthritis)   . Positive PPD    Took INH tabs x 3 months   There were no vitals taken for this visit.  Opioid Risk Score:   Fall Risk Score:  `1  Depression screen PHQ 2/9  Depression screen [facility] 2/9 02/20/2020 01/23/2020  Decreased Interest 0 1  Down, Depressed, Hopeless 0 0  PHQ - 2 Score 0 1  Altered sleeping - 3  Tired, decreased energy - 3  Change in appetite - 0  Feeling bad or failure about yourself  - 0  Trouble concentrating - 1  Moving slowly or fidgety/restless - 1  Suicidal thoughts - 0  PHQ-9 Score - 9  Difficult doing work/chores - Somewhat difficult    Review of Systems  Constitutional:  Negative.   HENT: Negative.   Eyes: Negative.   Respiratory: Negative.   Cardiovascular: Negative.   Gastrointestinal: Negative.   Endocrine: Negative.   Genitourinary: Negative.   Musculoskeletal: Positive for arthralgias, back pain and neck pain.       Left upper back pain  Allergic/Immunologic: Negative.   Hematological: Negative.   Psychiatric/Behavioral: Negative.   All other systems reviewed and are negative.      Objective:   Physical Exam Gen: no distress, normal appearing HEENT: oral mucosa pink and moist, NCAT Cardio: Reg rate Chest: normal effort, normal rate of breathing Abd: soft, non-distended Ext: no edema Psych: pleasant, normal affect Skin: intact Neuro: Alert and oriented x3.  Musculoskeletal:Diffuse tenderness throughout the body, upper and lower extremities, bilaterally Psych: pleasant, normal affect     Assessment & Plan:  Victoria Watson is a 70 year old woman who presents for follow-up of fibromyalgia.   1) Fibromyalgia -trigger point injections did not provide benefit, will not repeat.  -made goal to walk three times weekly in the morning- walks with neighbor Harriett Sine -Continue Lyrica 25mg  HS -Advised that Lyrica is better medication than Tramadol for fibromyalgia, but will prescribe Tramadol if it is helping her functionally. She will call or text when she needs a refill. For now, continue Tramadol 50mg  BID. Routine UDS obtained today. -Goal is to have improved energy and be able to play with her grandchildren, which she loves. -Recommended reading the Microbiome diet and following strict no sugar/no gluten/no eggs/no dairy diet for 3 weeks: can eat unlimited health fats (grass-fed butter, olive oil, olives, avocados), vegetables, fruits, fish, grass-fed meat.   2) Insomnia -She will let know is if she needs a refill of Lunesta -Advised regarding daily walk outside during sunrise to reset circadian rhythm. -Lyrica should also help with sleep- may no  longer need Lunesta.  3) Lumbar facet arthropathy: -core and paraspinal strengthening -can continue voltaren gel, advised that she can try blue emu oil  -avoid extension based exercises.   4) Lumbago: -likely secondary to bending and lifting her grandkids -discussed asking someone else to put them in her arms or using a squat position to lift them -recommended heat to lower back to help relax muscles.   All questions answered. RTC in 6 months

## 2020-06-14 ENCOUNTER — Other Ambulatory Visit: Payer: Self-pay | Admitting: Physical Medicine and Rehabilitation

## 2020-06-15 NOTE — Telephone Encounter (Signed)
Refill request for Pregabalin and Eszopiclone. I called patient to make sure that she requested refill on Eszopiclone and not the pharmacy automatic refill. Last note states "She will let us know is if she needs a refill of Lunesta", "Lyrica should also help with sleep- may no longer need Lunesta.". Patient states she requested the refill. Refill sent in for both medications.

## 2020-06-17 LAB — TOXASSURE SELECT,+ANTIDEPR,UR

## 2020-06-18 ENCOUNTER — Telehealth: Payer: Self-pay | Admitting: *Deleted

## 2020-06-18 NOTE — Telephone Encounter (Signed)
Urine drug screen for this encounter is consistent for prescribed medication 

## 2020-06-22 DIAGNOSIS — Z01812 Encounter for preprocedural laboratory examination: Secondary | ICD-10-CM | POA: Diagnosis not present

## 2020-06-25 DIAGNOSIS — Z1211 Encounter for screening for malignant neoplasm of colon: Secondary | ICD-10-CM | POA: Diagnosis not present

## 2020-06-25 DIAGNOSIS — K644 Residual hemorrhoidal skin tags: Secondary | ICD-10-CM | POA: Diagnosis not present

## 2020-06-25 DIAGNOSIS — Q438 Other specified congenital malformations of intestine: Secondary | ICD-10-CM | POA: Diagnosis not present

## 2020-06-25 DIAGNOSIS — K648 Other hemorrhoids: Secondary | ICD-10-CM | POA: Diagnosis not present

## 2020-06-25 DIAGNOSIS — K5730 Diverticulosis of large intestine without perforation or abscess without bleeding: Secondary | ICD-10-CM | POA: Diagnosis not present

## 2020-06-29 DIAGNOSIS — Z0100 Encounter for examination of eyes and vision without abnormal findings: Secondary | ICD-10-CM | POA: Diagnosis not present

## 2020-07-07 ENCOUNTER — Other Ambulatory Visit: Payer: Self-pay | Admitting: Physical Medicine and Rehabilitation

## 2020-07-13 ENCOUNTER — Other Ambulatory Visit: Payer: Self-pay | Admitting: Physical Medicine and Rehabilitation

## 2020-07-14 ENCOUNTER — Telehealth: Payer: Self-pay | Admitting: Physical Medicine and Rehabilitation

## 2020-07-14 NOTE — Telephone Encounter (Signed)
Yes can be refilled!

## 2020-07-14 NOTE — Telephone Encounter (Signed)
Refill Pregabalin. Patient not returning til August. Can this be filled with refills until returning to office for a f/u?

## 2020-07-16 ENCOUNTER — Other Ambulatory Visit: Payer: Self-pay | Admitting: Physical Medicine and Rehabilitation

## 2020-08-02 ENCOUNTER — Telehealth: Payer: Self-pay | Admitting: *Deleted

## 2020-08-02 NOTE — Telephone Encounter (Signed)
Prior auth for lyrica initiated and approved via CoverMyMeds. This approval authorizes your coverage from 04/24/2020 - 04/23/2021

## 2020-08-09 ENCOUNTER — Other Ambulatory Visit: Payer: Self-pay | Admitting: Physical Medicine and Rehabilitation

## 2020-09-08 ENCOUNTER — Other Ambulatory Visit: Payer: Self-pay | Admitting: Physical Medicine and Rehabilitation

## 2020-09-09 ENCOUNTER — Other Ambulatory Visit: Payer: Self-pay | Admitting: Physical Medicine and Rehabilitation

## 2020-11-05 ENCOUNTER — Other Ambulatory Visit: Payer: Self-pay | Admitting: Physical Medicine and Rehabilitation

## 2020-11-05 DIAGNOSIS — G4709 Other insomnia: Secondary | ICD-10-CM

## 2020-11-08 ENCOUNTER — Ambulatory Visit
Admission: EM | Admit: 2020-11-08 | Discharge: 2020-11-08 | Disposition: A | Discharge status: Home / Self Care | Payer: Medicare HMO | Attending: Physician Assistant | Admitting: Physician Assistant

## 2020-11-08 ENCOUNTER — Other Ambulatory Visit: Payer: Self-pay

## 2020-11-08 ENCOUNTER — Encounter: Payer: Self-pay | Admitting: Emergency Medicine

## 2020-11-08 DIAGNOSIS — S61411A Laceration without foreign body of right hand, initial encounter: Secondary | ICD-10-CM | POA: Diagnosis not present

## 2020-11-08 MED ORDER — SULFAMETHOXAZOLE-TRIMETHOPRIM 800-160 MG PO TABS: 1.0000 | ORAL_TABLET | Freq: Two times a day (BID) | ORAL | 0 refills | Status: DC

## 2020-11-08 NOTE — Discharge Instructions (Addendum)
Return if any problems.

## 2020-11-08 NOTE — ED Provider Notes (Signed)
[facility]    CSN: [id number] Arrival date & time: 11/08/20  0827      History   Chief Complaint Chief Complaint  Patient presents with   Extremity Laceration    HPI Victoria Watson is a 70 y.o. female.   The history is provided by the patient. No language interpreter was used.  Laceration Location:  Hand Hand laceration location:  R hand Length:  32mm Depth:  Through underlying tissue Time since incident:  12 hours Laceration mechanism:  Knife Pain details:    Quality:  Aching   Severity:  No pain Worsened by:  Nothing Tetanus status:  Up to date  Past Medical History:  Diagnosis Date   Asthma    Fibromyalgia    Insomnia    OA (osteoarthritis)    Positive PPD    Took INH tabs x 3 months    Patient Active Problem List   Diagnosis Date Noted   Asthma 08/13/2019   Chronic rhinitis 10/28/2018   Hypermobility of joint 11/01/2016   DDD (degenerative disc disease), thoracic 11/01/2016   Osteopenia of multiple sites 11/01/2016   Positive PPD treated with INH  11/01/2016   Rheumatoid factor positive -CCP  11/01/2016   Myofacial muscle pain 03/28/2016   Insomnia 03/28/2016   Other fatigue 03/28/2016   DDD (degenerative disc disease), lumbar 03/28/2016   DDD (degenerative disc disease), cervical 03/28/2016   Primary osteoarthritis of both hands 03/28/2016   Bronchiectasis (HCC) 07/09/2015   Wheezing 06/16/2015   Chest crackles 06/16/2015    Past Surgical History:  Procedure Laterality Date   TONSILLECTOMY     tooth implant Left    TOTAL ABDOMINAL HYSTERECTOMY  1990    OB History   No obstetric history on file.      Home Medications    Prior to Admission medications   Medication Sig Start Date End Date Taking? Authorizing Provider  sulfamethoxazole-trimethoprim (BACTRIM DS) 800-160 MG tablet Take 1 tablet by mouth 2 (two) times daily. 11/08/20  Yes Cheron Schaumann K, PA-C  albuterol (VENTOLIN HFA) 108 (90 Base) MCG/ACT inhaler Inhale  2 puffs into the lungs every 6 (six) hours as needed for wheezing or shortness of breath. Insurance preference 10/28/18   Parrett, Virgel Bouquet, NP  Biotin 5 MG TABS Take by mouth daily.    [provider]  Calcium Carbonate-Vitamin D (CALCIUM-VITAMIN D) 500-200 MG-UNIT tablet Take 1 tablet by mouth. 2 TO 3 TIMES WEEKLY    [provider]  Cholecalciferol (VITAMIN D PO) Take 1 tablet by mouth daily.    [provider]  cycloSPORINE (RESTASIS) 0.05 % ophthalmic emulsion Place 1 drop into both eyes 2 (two) times daily.    [provider]  diclofenac sodium (VOLTAREN) 1 % GEL Apply 3 grams to 3 large joints up to 3 times daily 09/05/17   Pollyann Savoy, MD  estradiol (ESTRACE) 1 MG tablet Take 1 mg by mouth daily.    [provider]  Eszopiclone 3 MG TABS TAKE ONE TABLET BY MOUTH EVERY NIGHT AT BEDTIME AS NEEDED FOR INSOMNIA 11/05/20   Raulkar, Drema Pry, MD  Fluticasone Propionate (FLONASE ALLERGY RELIEF NA) Place into the nose.    [provider]  Magnesium 250 MG TABS Take by mouth every other day.     [provider]  Multiple Vitamins-Minerals (MULTIVITAMIN WITH MINERALS) tablet Take 1 tablet by mouth daily.    [provider]  pregabalin (LYRICA) 25 MG capsule TAKE ONE CAPSULE BY  MOUTH TWICE A DAY 07/16/20   Raulkar, Drema Pry, MD  Respiratory Therapy Supplies (FLUTTER) DEVI Use as directed 10/28/18   Parrett, Virgel Bouquet, NP  SYMBICORT 80-4.5 MCG/ACT inhaler INHALE 2 PUFFS BY MOUTH TWICE DAILY 11/25/19   Parrett, Virgel Bouquet, NP  traMADol (ULTRAM) 50 MG tablet TAKE ONE TABLET BY MOUTH TWICE A DAY AS NEEDED 09/09/20   Raulkar, Drema Pry, MD  vitamin C (ASCORBIC ACID) 500 MG tablet Take 500 mg by mouth daily.    [provider]    Family History Family History  Problem Relation Age of Onset   Heart disease Father    COPD Father    Heart disease Paternal Grandmother    Heart disease Paternal Grandfather     Social  History Social History   Tobacco Use   Smoking status: Never   Smokeless tobacco: Never  Vaping Use   Vaping Use: Never used  Substance Use Topics   Alcohol use: No    Alcohol/week: 0.0 standard drinks   Drug use: No     Allergies   Patient has no known allergies.   Review of Systems Review of Systems  Skin:  Positive for wound.  All other systems reviewed and are negative.   Physical Exam Triage Vital Signs ED Triage Vitals  Enc Vitals Group     BP 11/08/20 0838 (!) 149/78     Pulse Rate 11/08/20 0838 86     Resp 11/08/20 0838 18     Temp 11/08/20 0838 98.2 F (36.8 C)     Temp Source 11/08/20 0838 Oral     SpO2 11/08/20 0838 98 %     Weight 11/08/20 0839 101 lb 6.6 oz (46 kg)     Height 11/08/20 0839 4\' 11"  (1.499 m)     Head Circumference --      Peak Flow --      Pain Score 11/08/20 0839 4     Pain Loc --      Pain Edu? --      Excl. in GC? --    No data found.  Updated Vital Signs BP (!) 149/78   Pulse 86   Temp 98.2 F (36.8 C) (Oral)   Resp 18   Ht 4\' 11"  (1.499 m)   Wt 46 kg   SpO2 98%   BMI 20.48 kg/m   Visual Acuity Right Eye Distance:   Left Eye Distance:   Bilateral Distance:    Right Eye Near:   Left Eye Near:    Bilateral Near:     Physical Exam Vitals reviewed.  Constitutional:      Appearance: Normal appearance.  Musculoskeletal:     Comments: 49mm laceration hand  crepitus  around wound  nv and ns intact   Skin:    General: Skin is warm.  Neurological:     General: No focal deficit present.     Mental Status: She is alert.  Psychiatric:        Mood and Affect: Mood normal.     UC Treatments / Results  Labs (all labs ordered are listed, but only abnormal results are displayed) Labs Reviewed - No data to display  EKG   Radiology No results found.  Procedures Procedures (including critical care time)  Medications Ordered in UC Medications - No data to display  Initial Impression / Assessment and Plan /  UC Course  I have reviewed the triage vital signs and the nursing notes.  Pertinent labs &  imaging results that were available during my care of the patient were reviewed by me and considered in my medical decision making (see chart for details).    MDM:  Pt counseled on wound care and monitoring for infection.   Final Clinical Impressions(s) / UC Diagnoses   Final diagnoses:  None     Discharge Instructions      Return if any problems.    ED Prescriptions     Medication Sig Dispense Auth. Provider   sulfamethoxazole-trimethoprim (BACTRIM DS) 800-160 MG tablet Take 1 tablet by mouth 2 (two) times daily. 14 tablet [place] Areas, [place]      PDMP not reviewed this encounter. An After Visit Summary was printed and given to the patient.    [place]son Areas, [place] 11/08/20 [phone]

## 2020-11-08 NOTE — ED Triage Notes (Signed)
Pt here with knife laceration while cooking last night between her right index finger and thumb. Bleeding well controlled, but has swelling throughout hand today and complains of crepitus.

## 2020-11-11 ENCOUNTER — Other Ambulatory Visit: Payer: Self-pay | Admitting: Physical Medicine and Rehabilitation

## 2020-11-17 ENCOUNTER — Other Ambulatory Visit: Payer: Self-pay | Admitting: Adult Health

## 2020-11-18 ENCOUNTER — Other Ambulatory Visit: Payer: Self-pay | Admitting: Adult Health

## 2020-11-19 ENCOUNTER — Other Ambulatory Visit: Payer: Self-pay | Admitting: Adult Health

## 2020-12-03 ENCOUNTER — Other Ambulatory Visit: Payer: Self-pay | Admitting: Adult Health

## 2020-12-06 ENCOUNTER — Telehealth: Payer: Self-pay | Admitting: Internal Medicine

## 2020-12-06 NOTE — Telephone Encounter (Signed)
ATC patient unable to reach she needs an appointment hasn't been seen in over a year

## 2020-12-06 NOTE — Telephone Encounter (Signed)
Pt was calling in regards to Symbicort refill stated that she has reached out to her pharmacy and they have reached out to [place] twice but it has been denied and she has been informed to reach out to our office.  Pharmacy; [facility]- [address] [place], [place] [address]   Pls regard; [phone]

## 2020-12-06 NOTE — Telephone Encounter (Signed)
Pt was calling in regards to Symbicort refill stated that she has reached out to her pharmacy and they have reached out to [place] twice but it has been denied and she has been informed to reach out to our office.   Pharmacy; [facility]- [address], [place], [place] [address]    Pls regard; [phone]  **Did not mean to sign previous encounter**

## 2020-12-06 NOTE — Telephone Encounter (Signed)
See previous phone note, tried calling patient she needs appointment been over a year

## 2020-12-07 ENCOUNTER — Ambulatory Visit: Payer: Medicare HMO | Admitting: Physical Medicine and Rehabilitation

## 2020-12-07 DIAGNOSIS — M8588 Other specified disorders of bone density and structure, other site: Secondary | ICD-10-CM | POA: Diagnosis not present

## 2020-12-07 DIAGNOSIS — Z01419 Encounter for gynecological examination (general) (routine) without abnormal findings: Secondary | ICD-10-CM | POA: Diagnosis not present

## 2020-12-07 DIAGNOSIS — N952 Postmenopausal atrophic vaginitis: Secondary | ICD-10-CM | POA: Diagnosis not present

## 2020-12-07 DIAGNOSIS — Z6820 Body mass index (BMI) 20.0-20.9, adult: Secondary | ICD-10-CM | POA: Diagnosis not present

## 2020-12-07 DIAGNOSIS — N958 Other specified menopausal and perimenopausal disorders: Secondary | ICD-10-CM | POA: Diagnosis not present

## 2020-12-07 DIAGNOSIS — Z1231 Encounter for screening mammogram for malignant neoplasm of breast: Secondary | ICD-10-CM | POA: Diagnosis not present

## 2020-12-08 ENCOUNTER — Ambulatory Visit: Payer: Medicare HMO | Admitting: Physical Medicine and Rehabilitation

## 2020-12-13 ENCOUNTER — Encounter: Payer: Self-pay | Admitting: Adult Health

## 2020-12-13 ENCOUNTER — Other Ambulatory Visit: Payer: Self-pay

## 2020-12-13 ENCOUNTER — Ambulatory Visit: Payer: Medicare HMO | Admitting: Adult Health

## 2020-12-13 DIAGNOSIS — L818 Other specified disorders of pigmentation: Secondary | ICD-10-CM | POA: Diagnosis not present

## 2020-12-13 DIAGNOSIS — J4520 Mild intermittent asthma, uncomplicated: Secondary | ICD-10-CM

## 2020-12-13 DIAGNOSIS — X32XXXD Exposure to sunlight, subsequent encounter: Secondary | ICD-10-CM | POA: Diagnosis not present

## 2020-12-13 DIAGNOSIS — J479 Bronchiectasis, uncomplicated: Secondary | ICD-10-CM

## 2020-12-13 DIAGNOSIS — L570 Actinic keratosis: Secondary | ICD-10-CM | POA: Diagnosis not present

## 2020-12-13 DIAGNOSIS — L820 Inflamed seborrheic keratosis: Secondary | ICD-10-CM | POA: Diagnosis not present

## 2020-12-13 MED ORDER — BUDESONIDE-FORMOTEROL FUMARATE 80-4.5 MCG/ACT IN AERO: 2.0000 | INHALATION_SPRAY | Freq: Two times a day (BID) | RESPIRATORY_TRACT | 11 refills | Status: DC

## 2020-12-13 MED ORDER — BUDESONIDE-FORMOTEROL FUMARATE 80-4.5 MCG/ACT IN AERO: 2.0000 | INHALATION_SPRAY | Freq: Two times a day (BID) | RESPIRATORY_TRACT | 5 refills | Status: DC

## 2020-12-13 NOTE — Progress Notes (Signed)
@Patient  ID: , female    DOB: 1951/01/17, 70 y.o.   MRN: [id number]  Chief Complaint  Patient presents with   Follow-up    Referring provider: [id number], MD  HPI: 70 year old female never smoker seen for pulmonary consult February 2017 for wheezing found to have mild bronchiectasis on CT scan COVID-19 infection January 2021  TEST/EVENTS :  HRCT chest that was neg for ILD. Mild Bronchiectasis/RML scarring. And extensive air trapping throughout lungs indicating small airways dz., 2 incidental LLL nodules 4 mm .    Methacholine challenge test was normal.  Patient had pulmonary function testing 07/2019 that showed normal lung function with no airflow restriction or obstruction.  FEV1 90%, ratio 82, FVC 83%, no significant bronchodilator response.,  Positive mid flow bronchodilator response.  DLCO is normal  12/13/2020 Follow up ; bronchiectasis Patient returns for a 1 year follow-up.  Patient says over the last year she has been doing very well.  She remains very active.  Patient has underlying mild bronchiectasis.  She is on Symbicort 80 twice daily.  Patient says since starting Symbicort she is had no further coughing.  She feels good and is very active.  She has no significant shortness of breath.  She uses her flutter valve most days 1-2 times.  No significant albuterol use. Patient denies any hemoptysis, unintentional weight loss.,  Chest pain or shortness of breath. Patient is very busy with her grandchildren.   No Known Allergies  Immunization History  Administered Date(s) Administered   Influenza,inj,Quad PF,6+ Mos 12/22/2014   Influenza-Unspecified 02/15/2019   Janssen (J&J) SARS-COV-2 Vaccination 08/02/2019   Tdap 09/03/2009   Zoster Recombinat (Shingrix) 05/07/2018, 09/27/2018   Zoster, Live 07/17/2018    Past Medical History:  Diagnosis Date   Asthma    Fibromyalgia    Insomnia    OA (osteoarthritis)    Positive PPD    Took INH tabs x 3  months    Tobacco History: Social History   Tobacco Use  Smoking Status Never  Smokeless Tobacco Never   Counseling given: Not Answered   Outpatient Medications Prior to Visit  Medication Sig Dispense Refill   albuterol (VENTOLIN HFA) 108 (90 Base) MCG/ACT inhaler Inhale 2 puffs into the lungs every 6 (six) hours as needed for wheezing or shortness of breath. Insurance preference 18 g 2   Biotin 5 MG TABS Take by mouth daily.     Calcium Carbonate-Vitamin D (CALCIUM-VITAMIN D) 500-200 MG-UNIT tablet Take 1 tablet by mouth. 2 TO 3 TIMES WEEKLY     Cholecalciferol (VITAMIN D PO) Take 1 tablet by mouth daily.     cycloSPORINE (RESTASIS) 0.05 % ophthalmic emulsion Place 1 drop into both eyes 2 (two) times daily.     diclofenac sodium (VOLTAREN) 1 % GEL Apply 3 grams to 3 large joints up to 3 times daily 3 Tube 3   estradiol (ESTRACE) 1 MG tablet Take 1 mg by mouth daily.     Eszopiclone 3 MG TABS TAKE ONE TABLET BY MOUTH EVERY NIGHT AT BEDTIME AS NEEDED FOR INSOMNIA 30 tablet 3   Fluticasone Propionate (FLONASE ALLERGY RELIEF NA) Place into the nose.     Magnesium 250 MG TABS Take by mouth every other day.      Multiple Vitamins-Minerals (MULTIVITAMIN WITH MINERALS) tablet Take 1 tablet by mouth daily.     pregabalin (LYRICA) 25 MG capsule TAKE ONE CAPSULE BY MOUTH TWICE A DAY 60 capsule 5   Respiratory Therapy Supplies (  FLUTTER) DEVI Use as directed 1 each 0   traMADol (ULTRAM) 50 MG tablet TAKE 1 TABLET BY MOUTH TWO TIMES A DAY 60 tablet 3   vitamin C (ASCORBIC ACID) 500 MG tablet Take 500 mg by mouth daily.     SYMBICORT 80-4.5 MCG/ACT inhaler INHALE 2 PUFFS BY MOUTH TWICE DAILY 10.2 g 5   sulfamethoxazole-trimethoprim (BACTRIM DS) 800-160 MG tablet Take 1 tablet by mouth 2 (two) times daily. (Patient not taking: Reported on 12/13/2020) 14 tablet 0   No facility-administered medications prior to visit.     Review of Systems:   Constitutional:   No  weight loss, night sweats,   Fevers, chills, fatigue, or  lassitude.  HEENT:   No headaches,  Difficulty swallowing,  Tooth/dental problems, or  Sore throat,                No sneezing, itching, ear ache, nasal congestion, post nasal drip,   CV:  No chest pain,  Orthopnea, PND, swelling in lower extremities, anasarca, dizziness, palpitations, syncope.   GI  No heartburn, indigestion, abdominal pain, nausea, vomiting, diarrhea, change in bowel habits, loss of appetite, bloody stools.   Resp: .  No chest wall deformity  Skin: no rash or lesions.  GU: no dysuria, change in color of urine, no urgency or frequency.  No flank pain, no hematuria   MS:  No joint pain or swelling.  No decreased range of motion.  No back pain.    Physical Exam  BP 110/70 (BP Location: Left Arm, Patient Position: Sitting, Cuff Size: Normal)   Pulse 85   Temp 97.9 F (36.6 C) (Oral)   Ht 4\' 10"  (1.473 m)   Wt 99 lb 3.2 oz (45 kg)   SpO2 97%   BMI 20.73 kg/m   GEN: A/Ox3; pleasant , NAD, well nourished    HEENT:  [place]/AT,  NOSE-clear, THROAT-clear, no lesions, no postnasal drip or exudate noted.   NECK:  Supple w/ fair ROM; no JVD; normal carotid impulses w/o bruits; no thyromegaly or nodules palpated; no lymphadenopathy.    RESP  Clear  P & A; w/o, wheezes/ rales/ or rhonchi. no accessory muscle use, no dullness to percussion  CARD:  RRR, no m/r/g, no peripheral edema, pulses intact, no cyanosis or clubbing.  GI:   Soft & nt; nml bowel sounds; no organomegaly or masses detected.   Musco: Warm bil, no deformities or joint swelling noted.   Neuro: alert, no focal deficits noted.    Skin: Warm, no lesions or rashes    Lab Results:    BNP No results found for: BNP  ProBNP No results found for: PROBNP  Imaging: No results found.    PFT Results Latest Ref Rng & Units 08/12/2019 06/29/2015 06/28/2015  FVC-Pre L 2.05 2.33 2.24  FVC-Predicted Pre % 82 90 86  FVC-Post L 2.08 2.17 2.27  FVC-Predicted Post % 83 83 87  Pre  FEV1/FVC % % 80 78 82  Post FEV1/FCV % % 82 83 80  FEV1-Pre L 1.63 1.82 1.83  FEV1-Predicted Pre % 86 91 92  FEV1-Post L 1.72 1.80 1.83  DLCO uncorrected ml/min/mmHg 18.70 18.28 -  DLCO UNC% % 115 108 -  DLCO corrected ml/min/mmHg 18.70 - -  DLCO COR %Predicted % 115 - -  DLVA Predicted % 132 132 -  TLC L 4.20 4.07 -  TLC % Predicted % 99 96 -  RV % Predicted % 112 98 -    Lab  Results  Component Value Date   NITRICOXIDE 15 06/16/2015        Assessment & Plan:   Bronchiectasis (HCC) Bronchiectasis well-controlled.  Continue on pulmonary hygiene regimen.  Plan  Patient Instructions  Symbicort 80 2 puffs Twice daily  , rinse after use.  Albuterol Inhaler 2 puffs every 4hr as needed Flutter valve  Twice daily  As needed   Flu shot this fall.  Follow up with Dr. Marchelle Gearing or Adalaide Jaskolski NP in 1 year and As needed   Please contact office for sooner follow up if symptoms do not improve or worsen or seek emergency care       Asthma Mild asthma currently good control with Symbicort  Plan  Patient Instructions  Symbicort 80 2 puffs Twice daily  , rinse after use.  Albuterol Inhaler 2 puffs every 4hr as needed Flutter valve  Twice daily  As needed   Flu shot this fall.  Follow up with Dr. Marchelle Gearing or Zooey Schreurs NP in 1 year and As needed   Please contact office for sooner follow up if symptoms do not improve or worsen or seek emergency care         Rubye Oaks, NP 12/13/2020

## 2020-12-13 NOTE — Assessment & Plan Note (Signed)
Mild asthma currently good control with Symbicort  Plan  Patient Instructions  Symbicort 80 2 puffs Twice daily  , rinse after use.  Albuterol Inhaler 2 puffs every 4hr as needed Flutter valve  Twice daily  As needed   Flu shot this fall.  Follow up with Dr. Marchelle Gearing or Temitayo Covalt NP in 1 year and As needed   Please contact office for sooner follow up if symptoms do not improve or worsen or seek emergency care

## 2020-12-13 NOTE — Patient Instructions (Addendum)
Symbicort 80 2 puffs Twice daily  , rinse after use.  Albuterol Inhaler 2 puffs every 4hr as needed Flutter valve  Twice daily  As needed   Flu shot this fall.  Follow up with Dr. Marchelle Gearing or Shawnte Winton NP in 1 year and As needed   Please contact office for sooner follow up if symptoms do not improve or worsen or seek emergency care

## 2020-12-13 NOTE — Assessment & Plan Note (Signed)
Bronchiectasis well-controlled.  Continue on pulmonary hygiene regimen.  Plan  Patient Instructions  Symbicort 80 2 puffs Twice daily  , rinse after use.  Albuterol Inhaler 2 puffs every 4hr as needed Flutter valve  Twice daily  As needed   Flu shot this fall.  Follow up with Dr. Marchelle Gearing or Kaena Santori NP in 1 year and As needed   Please contact office for sooner follow up if symptoms do not improve or worsen or seek emergency care

## 2020-12-24 ENCOUNTER — Other Ambulatory Visit: Payer: Self-pay

## 2020-12-24 ENCOUNTER — Encounter: Payer: Medicare HMO | Attending: Physical Medicine and Rehabilitation | Admitting: Physical Medicine and Rehabilitation

## 2020-12-24 DIAGNOSIS — M797 Fibromyalgia: Secondary | ICD-10-CM | POA: Insufficient documentation

## 2020-12-24 DIAGNOSIS — M19041 Primary osteoarthritis, right hand: Secondary | ICD-10-CM | POA: Insufficient documentation

## 2020-12-24 DIAGNOSIS — M7918 Myalgia, other site: Secondary | ICD-10-CM | POA: Insufficient documentation

## 2020-12-24 NOTE — Patient Instructions (Signed)
Insomnia: -Try to go outside near sunrise -Get exercise during the day.  -Discussed good sleep hygiene: turning off all devices an hour before bedtime.  -Chamomile, valerian root, rose hips, Brahmi tea with dinner.  -Can consider over the counter melatonin

## 2020-12-24 NOTE — Progress Notes (Addendum)
Subjective:    Patient ID: Victoria Watson, female    DOB: 09-Dec-1950, 70 y.o.   MRN: [id number]  HPI  Victoria Watson is a 70 year old woman who presents to follow-up for fibromyalgia and low back pain.   1) Low back pain has worsened: -She has been having worsening low back pain recently -She feels it is from lifting her grandchildren  -She has asked them to come on her lap instead  2) Cervical myofascial pain: -She did not get relief from the trigger point injection last visit -She has a massage scheduled today.   3) Fibromyalgia: -She is currently taking Tramadol twice per day -Her pain has been better controlled with the Lyrica and she not needs as much as Tylenol. Continues to take 25mg  Lyrica at night and does not require a higher dose -pai has been a little increased due to her   4) Insomnia Her sleep has worsened, she thinks due to her mother's illness.   5) Right hand third digit swollen joint -she has been trying to move it a lot -causes pain that radiates down through finger -she as been using voltaren gel  Prior history:  She has seen Dr. since 2009 and has been on Tramadol 50mg  BID since this time. She has been trying to take one per day since she was told in August that Dr. office would no longer be able to prescribe. It helps her get up and down the steps, do laundry, and to play with her grandchildren.   Her husband took her to a holistic provider and she had told her not to try Lyrica so she has never tried this.   She went to anaerobic dance class in the spring. She mainly exercises with her grandchildren. She walks twice a week in the morning. Also enjoys dancing and playing with her grandchildren.  She sleeps poorly at night- difficulty falling asleep. She takes Fatima Sanger.   Pain Inventory Average Pain 6 Pain Right Now 5 My pain is intermittent, constant, sharp, stabbing, and aching  In the last 24 hours, has pain interfered with  the following? General activity 4 Relation with others 0 Enjoyment of life 0 What TIME of day is your pain at its worst? morning , evening, and night Sleep (in general) Fair  Pain is worse with: bending and some activites Pain improves with: rest, heat/ice, therapy/exercise, medication, and heat Relief from Meds: 8    Family History  Problem Relation Age of Onset   Heart disease Father    COPD Father    Heart disease Paternal Grandmother    Heart disease Paternal Grandfather    Social History   Socioeconomic History   Marital status: Married    Spouse name: Not on file   Number of children: Not on file   Years of education: Not on file   Highest education level: Not on file  Occupational History   Occupation: retired  Tobacco Use   Smoking status: Never   Smokeless tobacco: Never  Vaping Use   Vaping Use: Never used  Substance and Sexual Activity   Alcohol use: No    Alcohol/week: 0.0 standard drinks   Drug use: No   Sexual activity: Not on file  Other Topics Concern   Not on file  Social History Narrative   Not on file   Social Determinants of Health   Financial Resource Strain: Not on file  Food Insecurity: Not on file  Transportation Needs: Not  on file  Physical Activity: Not on file  Stress: Not on file  Social Connections: Not on file   Past Surgical History:  Procedure Laterality Date   TONSILLECTOMY     tooth implant Left    TOTAL ABDOMINAL HYSTERECTOMY  1990   Past Medical History:  Diagnosis Date   Asthma    Fibromyalgia    Insomnia    OA (osteoarthritis)    Positive PPD    Took INH tabs x 3 months   There were no vitals taken for this visit.  Opioid Risk Score:   Fall Risk Score:  `1  Depression screen PHQ 2/9  Depression screen [facility] 2/9 12/24/2020 02/20/2020 01/23/2020  Decreased Interest 0 0 1  Down, Depressed, Hopeless 0 0 0  PHQ - 2 Score 0 0 1  Altered sleeping - - 3  Tired, decreased energy - - 3  Change in appetite - - 0   Feeling bad or failure about yourself  - - 0  Trouble concentrating - - 1  Moving slowly or fidgety/restless - - 1  Suicidal thoughts - - 0  PHQ-9 Score - - 9  Difficult doing work/chores - - Somewhat difficult    Review of Systems  Constitutional: Negative.   HENT: Negative.    Eyes: Negative.   Respiratory: Negative.    Cardiovascular: Negative.   Gastrointestinal: Negative.   Endocrine: Negative.   Genitourinary: Negative.   Musculoskeletal:  Positive for arthralgias, back pain and neck pain.       Left upper back pain  Skin: Negative.   Allergic/Immunologic: Negative.   Neurological: Negative.   Hematological: Negative.   Psychiatric/Behavioral: Negative.    All other systems reviewed and are negative.     Objective:   Physical Exam Gen: no distress, normal appearing HEENT: oral mucosa pink and moist, NCAT Cardio: Reg rate Chest: normal effort, normal rate of breathing Abd: soft, non-distended Ext: no edema Psych: pleasant, normal affect Skin: intact Neuro: Alert and oriented x3.  Musculoskeletal:Diffuse tenderness throughout the body, upper and lower extremities, bilaterally Psych: pleasant, normal affect     Assessment & Plan:  Victoria Watson is a 70 year old woman who presents for follow-up of fibromyalgia.   1) Fibromyalgia -trigger point injections did not provide benefit, will not repeat.  -made goal to walk three times weekly in the morning- walks with neighbor Harriett Sine -Continue Lyrica 25mg  HS -Advised that Lyrica is better medication than Tramadol for fibromyalgia, but will prescribe Tramadol if it is helping her functionally. She will call or text when she needs a refill. For now, continue Tramadol 50mg  BID. Routine UDS obtained today. -Goal is to have improved energy and be able to play with her grandchildren, which she loves. -Recommended reading the Microbiome diet and following strict no sugar/no gluten/no eggs/no dairy diet for 3 weeks: can eat  unlimited health fats (grass-fed butter, olive oil, olives, avocados), vegetables, fruits, fish, grass-fed meat.   2) Insomnia -She will let know is if she needs a refill of Lunesta, continue -Advised regarding daily walk outside during sunrise to reset circadian rhythm. -Lyrica should also help with sleep- may no longer need Lunesta. Insomnia: -Try to go outside near sunrise -Get exercise during the day.  -Discussed good sleep hygiene: turning off all devices an hour before bedtime.  -Chamomile tea with dinner.  -Can consider over the counter melatonin  3) Lumbar facet arthropathy: -core and paraspinal strengthening -can continue voltaren gel, advised that she can try blue  emu oil  -avoid extension based exercises.   4) Lumbago: -likely secondary to bending and lifting her grandkids -discussed asking someone else to put them in her arms or using a squat position to lift them -recommended heat to lower back to help relax muscles.   5) OA of hands -apply voltaren gel  All questions answered. RTC in 6 months

## 2021-02-02 DIAGNOSIS — Z23 Encounter for immunization: Secondary | ICD-10-CM | POA: Diagnosis not present

## 2021-03-07 ENCOUNTER — Other Ambulatory Visit: Payer: Self-pay | Admitting: Physical Medicine and Rehabilitation

## 2021-03-07 DIAGNOSIS — M545 Low back pain, unspecified: Secondary | ICD-10-CM

## 2021-03-07 DIAGNOSIS — G4709 Other insomnia: Secondary | ICD-10-CM

## 2021-03-07 NOTE — Telephone Encounter (Signed)
Refill request for Lunesta and Tramadol

## 2021-04-22 ENCOUNTER — Other Ambulatory Visit: Payer: Self-pay

## 2021-04-22 ENCOUNTER — Encounter: Payer: Self-pay | Admitting: Physical Medicine and Rehabilitation

## 2021-04-22 ENCOUNTER — Encounter: Payer: Medicare HMO | Attending: Physical Medicine and Rehabilitation | Admitting: Physical Medicine and Rehabilitation

## 2021-04-22 VITALS — BP 150/78 | HR 97

## 2021-04-22 DIAGNOSIS — M797 Fibromyalgia: Secondary | ICD-10-CM | POA: Diagnosis not present

## 2021-04-22 DIAGNOSIS — M7918 Myalgia, other site: Secondary | ICD-10-CM | POA: Diagnosis not present

## 2021-04-22 NOTE — Patient Instructions (Signed)
N-acetyl-cysteine (NAC) 600mg  BID

## 2021-04-22 NOTE — Progress Notes (Signed)
Subjective:    Patient ID: Victoria Watson, female    DOB: [birth year], 70 y.o.   MRN: [id number]  HPI  Victoria Watson is a 70 year old woman who presents for  follow-up of fibromyalgia and low back pain.   1) Low back pain has worsened: -She has been having worsening low back pain recently -She feels it is from lifting her grandchildren  -She has asked them to come on her lap instead -she has been busy over Christmas  2) Cervical myofascial pain: -She did not get relief from the trigger point injection last visit -She has a massage scheduled today.  -she is having a lot of pain on the right side of her neck- her husband was concerned -she took her tramadol 50mg  BID -She took her lyrica at night -she used to the trigger point injections before Christmas  3) Fibromyalgia: -She is currently taking Tramadol twice per day -Her pain has been better controlled with the Lyrica and she not needs as much as Tylenol. Continues to take 25mg  Lyrica at night and does not require a higher dose -pai has been a little increased due to her   4) Insomnia Her sleep has worsened, she thinks due to her mother's illness.   5) Right hand third digit swollen joint -she has been trying to move it a lot -causes pain that radiates down through finger -she as been using voltaren gel  Prior history:  She has seen Dr. 11-15-1969 since 2009 and has been on Tramadol 50mg  BID since this time. She has been trying to take one per day since she was told in August that Dr. 2010 office would no longer be able to prescribe. It helps her get up and down the steps, do laundry, and to play with her grandchildren.   Her husband took her to a holistic provider and she had told her not to try Lyrica so she has never tried this.   She went to anaerobic dance class in the spring. She mainly exercises with her grandchildren. She walks twice a week in the morning. Also enjoys dancing and playing with her  grandchildren.  She sleeps poorly at night- difficulty falling asleep. She takes September.   Pain Inventory Average Pain 9 Pain Right Now 9 My pain is tingling, aching, and tender to touch  In the last 24 hours, has pain interfered with the following? General activity 4 Relation with others 0 Enjoyment of life 0 What TIME of day is your pain at its worst? morning , evening, and night Sleep (in general) Fair  Pain is worse with: bending and some activites Pain improves with: rest, heat/ice, therapy/exercise, medication, and heat Relief from Meds: 8    Family History  Problem Relation Age of Onset   Heart disease Father    COPD Father    Heart disease Paternal Grandmother    Heart disease Paternal Grandfather    Social History   Socioeconomic History   Marital status: Married    Spouse name: Not on file   Number of children: Not on file   Years of education: Not on file   Highest education level: Not on file  Occupational History   Occupation: retired  Tobacco Use   Smoking status: Never   Smokeless tobacco: Never  Vaping Use   Vaping Use: Never used  Substance and Sexual Activity   Alcohol use: No    Alcohol/week: 0.0 standard drinks   Drug use: No   Sexual  activity: Not on file  Other Topics Concern   Not on file  Social History Narrative   Not on file   Social Determinants of Health   Financial Resource Strain: Not on file  Food Insecurity: Not on file  Transportation Needs: Not on file  Physical Activity: Not on file  Stress: Not on file  Social Connections: Not on file   Past Surgical History:  Procedure Laterality Date   TONSILLECTOMY     tooth implant Left    TOTAL ABDOMINAL HYSTERECTOMY  1990   Past Medical History:  Diagnosis Date   Asthma    Fibromyalgia    Insomnia    OA (osteoarthritis)    Positive PPD    Took INH tabs x 3 months   There were no vitals taken for this visit.  Opioid Risk Score:   Fall Risk Score:   `1  Depression screen PHQ 2/9  Depression screen [facility] 2/9 12/24/2020 02/20/2020 01/23/2020  Decreased Interest 0 0 1  Down, Depressed, Hopeless 0 0 0  PHQ - 2 Score 0 0 1  Altered sleeping - - 3  Tired, decreased energy - - 3  Change in appetite - - 0  Feeling bad or failure about yourself  - - 0  Trouble concentrating - - 1  Moving slowly or fidgety/restless - - 1  Suicidal thoughts - - 0  PHQ-9 Score - - 9  Difficult doing work/chores - - Somewhat difficult    Review of Systems  Constitutional: Negative.   HENT: Negative.    Eyes: Negative.   Respiratory: Negative.    Cardiovascular: Negative.   Gastrointestinal: Negative.   Endocrine: Negative.   Genitourinary: Negative.   Musculoskeletal:  Positive for arthralgias, back pain and neck pain.       Left upper back pain  Skin: Negative.   Allergic/Immunologic: Negative.   Neurological: Negative.   Hematological: Negative.   Psychiatric/Behavioral: Negative.    All other systems reviewed and are negative.     Objective:   Physical Exam Gen: no distress, normal appearing HEENT: oral mucosa pink and moist, NCAT Cardio: Reg rate Chest: normal effort, normal rate of breathing Abd: soft, non-distended Ext: no edema Psych: pleasant, normal affect Skin: intact Neuro: Alert and oriented x3.  Musculoskeletal:Diffuse tenderness throughout the body, upper and lower extremities, bilaterally, trigger point to right splenius capitus.  Psych: pleasant, normal affect     Assessment & Plan:  Victoria Watson is a 70 year old woman who presents for follow-up of fibromyalgia.   1) Fibromyalgia -trigger point injections did not provide benefit, will not repeat.  -made goal to walk three times weekly in the morning- walks with neighbor Harriett Sine -Continue  Lyrica 25mg  HS -Advised that Lyrica is better medication than Tramadol for fibromyalgia, but will prescribe Tramadol if it is helping her functionally. She will call or text when she needs  a refill. For now, continue Tramadol 50mg  BID. Routine UDS obtained today. -Goal is to have improved energy and be able to play with her grandchildren, which she loves. -Recommended reading the Microbiome diet and following strict no sugar/no gluten/no eggs/no dairy diet for 3 weeks: can eat unlimited health fats (grass-fed butter, olive oil, olives, avocados), vegetables, fruits, fish, grass-fed meat.   2) Insomnia -She will let know is if she needs a refill of Lunesta, continue -Advised regarding daily walk outside during sunrise to reset circadian rhythm. -Lyrica should also help with sleep- may no longer need Lunesta. Insomnia: -Try  to go outside near sunrise -Get exercise during the day.  -Discussed good sleep hygiene: turning off all devices an hour before bedtime.  -Chamomile tea with dinner.  -Can consider over the counter melatonin  3) Lumbar facet arthropathy: -core and paraspinal strengthening -can continue voltaren gel, advised that she can try blue emu oil  -avoid extension based exercises.   4) Lumbago: -likely secondary to bending and lifting her grandkids -discussed asking someone else to put them in her arms or using a squat position to lift them -recommended heat to lower back to help relax muscles.  -continue turmeric -continue massage chair  5) OA of hands -apply voltaren gel  6) Cervical myofascial pain syndrome: -Cervical collar for kinesthetic reminder through sensory feedback to limit motion, and to retain body heat to reduce muscle spasm. Should wear for maximum of 10 days to minimize dependency/muscle weakness.   -discussed PT and OT -heat  -discussed muscle relaxers --Provided with a pain relief journal and discussed that it contains foods and lifestyle tips to naturally help to improve pain. Discussed that these lifestyle strategies are also very good for health unlike some medications which can have negative side effects. Discussed that the act of  keeping a journal can be therapeutic and helpful to realize patterns what helps to trigger and alleviate pain. -NAC 600mg  BID  -apply lavender.     All questions answered. RTC in 6 months

## 2021-05-12 ENCOUNTER — Other Ambulatory Visit: Payer: Self-pay | Admitting: Physical Medicine and Rehabilitation

## 2021-05-16 ENCOUNTER — Encounter
Payer: Medicare Other | Attending: Physical Medicine and Rehabilitation | Admitting: Physical Medicine and Rehabilitation

## 2021-05-16 ENCOUNTER — Other Ambulatory Visit: Payer: Self-pay

## 2021-05-16 VITALS — BP 131/79 | HR 77 | Ht <= 58 in | Wt 100.6 lb

## 2021-05-16 DIAGNOSIS — G894 Chronic pain syndrome: Secondary | ICD-10-CM | POA: Diagnosis present

## 2021-05-16 DIAGNOSIS — Z79891 Long term (current) use of opiate analgesic: Secondary | ICD-10-CM | POA: Insufficient documentation

## 2021-05-16 DIAGNOSIS — Z5181 Encounter for therapeutic drug level monitoring: Secondary | ICD-10-CM | POA: Insufficient documentation

## 2021-05-16 NOTE — Progress Notes (Signed)
Trigger Point Injection ° °Indication: Cervical myofascial pain not relieved by medication management and other conservative care. ° °Informed consent was obtained after describing risk and benefits of the procedure with the patient, this includes bleeding, bruising, infection and medication side effects.  The patient wishes to proceed and has given written consent.  The patient was placed in a seated position.  The area of pain was marked and prepped with Betadine.  It was entered with a 25-gauge 1/2 inch needle and a total of 5 mL of 1% lidocaine and normal saline was injected into a total of 6 trigger points, after negative draw back for blood.  The patient tolerated the procedure well.  Post procedure instructions were given.  °

## 2021-05-21 LAB — TOXASSURE SELECT,+ANTIDEPR,UR

## 2021-05-24 ENCOUNTER — Telehealth: Payer: Self-pay | Admitting: *Deleted

## 2021-05-24 NOTE — Telephone Encounter (Signed)
Urine drug screen for this encounter is consistent for prescribed medication 

## 2021-06-23 ENCOUNTER — Encounter
Payer: Medicare Other | Attending: Physical Medicine and Rehabilitation | Admitting: Physical Medicine and Rehabilitation

## 2021-06-23 ENCOUNTER — Other Ambulatory Visit: Payer: Self-pay

## 2021-06-23 VITALS — BP 148/79 | HR 85 | Ht <= 58 in | Wt 101.0 lb

## 2021-06-23 DIAGNOSIS — M7918 Myalgia, other site: Secondary | ICD-10-CM | POA: Insufficient documentation

## 2021-06-24 NOTE — Progress Notes (Signed)
Trigger Point Injection ° °Indication: Cervical myofascial pain not relieved by medication management and other conservative care. ° °Informed consent was obtained after describing risk and benefits of the procedure with the patient, this includes bleeding, bruising, infection and medication side effects.  The patient wishes to proceed and has given written consent.  The patient was placed in a seated position.  The area of pain was marked and prepped with Betadine.  It was entered with a 25-gauge 1/2 inch needle and a total of 5 mL of 1% lidocaine and normal saline was injected into a total of 6 trigger points, after negative draw back for blood.  The patient tolerated the procedure well.  Post procedure instructions were given.  °

## 2021-06-30 ENCOUNTER — Other Ambulatory Visit: Payer: Self-pay | Admitting: Physical Medicine and Rehabilitation

## 2021-06-30 DIAGNOSIS — M545 Low back pain, unspecified: Secondary | ICD-10-CM

## 2021-06-30 DIAGNOSIS — G4709 Other insomnia: Secondary | ICD-10-CM

## 2021-07-04 ENCOUNTER — Other Ambulatory Visit: Payer: Self-pay | Admitting: Physical Medicine and Rehabilitation

## 2021-07-04 DIAGNOSIS — G4709 Other insomnia: Secondary | ICD-10-CM

## 2021-07-04 DIAGNOSIS — M545 Low back pain, unspecified: Secondary | ICD-10-CM

## 2021-07-05 MED ORDER — TRAMADOL HCL 50 MG PO TABS
50.0000 mg | ORAL_TABLET | Freq: Two times a day (BID) | ORAL | 3 refills | Status: DC
Start: 2021-07-05 — End: 2021-07-06

## 2021-07-05 NOTE — Addendum Note (Signed)
Addended by: Doreene Eland on: 07/05/2021 02:28 PM ? ? Modules accepted: Orders ? ?

## 2021-07-05 NOTE — Telephone Encounter (Signed)
Mrs Philbert called because [facility] did not have her tramadol. I looked at Rx sent today and it was on PRINT so it did not transmit to pharmacy. I have called it to [facility] and left on VM and let Mrs Diehl know what happened. ?

## 2021-07-06 ENCOUNTER — Telehealth: Payer: Self-pay | Admitting: *Deleted

## 2021-07-06 ENCOUNTER — Other Ambulatory Visit: Payer: Self-pay | Admitting: Physical Medicine and Rehabilitation

## 2021-07-06 DIAGNOSIS — G4709 Other insomnia: Secondary | ICD-10-CM

## 2021-07-06 DIAGNOSIS — M545 Low back pain, unspecified: Secondary | ICD-10-CM

## 2021-07-06 MED ORDER — ESZOPICLONE 3 MG PO TABS: ORAL_TABLET | ORAL | 0 refills | Status: DC

## 2021-07-06 MED ORDER — TRAMADOL HCL 50 MG PO TABS: 50.0000 mg | ORAL_TABLET | Freq: Two times a day (BID) | ORAL | 3 refills | Status: DC

## 2021-07-06 MED ORDER — PREGABALIN 25 MG PO CAPS: ORAL_CAPSULE | ORAL | 3 refills | Status: DC

## 2021-07-06 NOTE — Telephone Encounter (Signed)
Tramadol order by Dr Carlis Abbott was on PRINT so it did not go to the pharmacy.  I tried to call it in but they will not accept a phone in on opioids. So she will need a new Rx sent for the tramadol, lunesta and pregabalin. (Make sure they are set on normal to transmit) ?

## 2021-07-06 NOTE — Telephone Encounter (Signed)
Notified they have been sent. ?

## 2021-08-02 ENCOUNTER — Other Ambulatory Visit: Payer: Self-pay | Admitting: Physical Medicine and Rehabilitation

## 2021-08-02 DIAGNOSIS — G4709 Other insomnia: Secondary | ICD-10-CM

## 2021-08-03 ENCOUNTER — Other Ambulatory Visit: Payer: Self-pay | Admitting: Physical Medicine and Rehabilitation

## 2021-08-03 ENCOUNTER — Telehealth: Payer: Self-pay | Admitting: *Deleted

## 2021-08-03 DIAGNOSIS — G4709 Other insomnia: Secondary | ICD-10-CM

## 2021-08-03 MED ORDER — ESZOPICLONE 3 MG PO TABS: ORAL_TABLET | ORAL | 3 refills | Status: DC

## 2021-08-03 NOTE — Telephone Encounter (Signed)
Melessia called and the pharmacy denied her lunesta refill. It looks like when you reordered the rx was on print. Please change to normal and send again. Thx. ?

## 2021-08-04 ENCOUNTER — Telehealth: Payer: Self-pay

## 2021-08-04 NOTE — Telephone Encounter (Signed)
PA for Eszopiclone sent to insurance through CoverMyMeds ?

## 2021-08-05 NOTE — Telephone Encounter (Signed)
Insurance denied Eszopiclone. They stated she has to try and fail 3 alternative medications. These are Belsomra, Dayvigo, Doxepin HCL 3 mg and 6 mg, Ramelteon, Temazepam 15 mg and 30 mg and Zaleplon. Belsomra and Jarold Motto will require a PA. I have put the appeal paperwork in your box for review. ?

## 2021-08-05 NOTE — Telephone Encounter (Signed)
Spoke with patient and informed her that the Eszopiclone was denied. She stated she spoke with some one from [facility] and they informed her that she would have to try the alternatives. She stated she has tried Belsomra and it gave her nightmares and Doxepin did not work. She stated she has tried others but do not remember the names. She stated the representative from [facility] asked her to inform us to appeal the decision and to add on there that the patient has tried alternatives and none of the medications on their drug list will work. ?

## 2021-08-08 ENCOUNTER — Other Ambulatory Visit: Payer: Self-pay | Admitting: Physical Medicine and Rehabilitation

## 2021-08-08 MED ORDER — TEMAZEPAM 15 MG PO CAPS: 15.0000 mg | ORAL_CAPSULE | Freq: Every evening | ORAL | 4 refills | Status: DC | PRN

## 2021-09-01 ENCOUNTER — Other Ambulatory Visit: Payer: Self-pay | Admitting: Physical Medicine and Rehabilitation

## 2021-09-05 ENCOUNTER — Encounter: Payer: Self-pay | Admitting: Physical Medicine and Rehabilitation

## 2021-09-05 ENCOUNTER — Encounter
Payer: Medicare Other | Attending: Physical Medicine and Rehabilitation | Admitting: Physical Medicine and Rehabilitation

## 2021-09-05 VITALS — BP 122/81 | HR 101 | Ht <= 58 in | Wt 100.8 lb

## 2021-09-05 DIAGNOSIS — M7918 Myalgia, other site: Secondary | ICD-10-CM | POA: Diagnosis present

## 2021-09-05 NOTE — Patient Instructions (Signed)
?-  provided a link to a pdf of Victoria Watson's musculoskeletal alignment exercises: [email or web address]  ?

## 2021-09-05 NOTE — Progress Notes (Signed)

## 2021-09-28 ENCOUNTER — Other Ambulatory Visit: Payer: Self-pay | Admitting: Adult Health

## 2021-11-15 ENCOUNTER — Other Ambulatory Visit: Payer: Self-pay | Admitting: Physical Medicine and Rehabilitation

## 2021-11-15 DIAGNOSIS — M545 Low back pain, unspecified: Secondary | ICD-10-CM

## 2021-11-28 ENCOUNTER — Telehealth: Payer: Self-pay | Admitting: Adult Health

## 2021-11-28 MED ORDER — BUDESONIDE-FORMOTEROL FUMARATE 80-4.5 MCG/ACT IN AERO: 2.0000 | INHALATION_SPRAY | Freq: Two times a day (BID) | RESPIRATORY_TRACT | 0 refills | Status: DC

## 2021-11-28 NOTE — Telephone Encounter (Signed)
1 month refill of Symbicort sent in for pt. Pt notified. Nothing further needed at this time.

## 2021-12-01 ENCOUNTER — Other Ambulatory Visit: Payer: Self-pay | Admitting: Physical Medicine and Rehabilitation

## 2021-12-01 DIAGNOSIS — G4709 Other insomnia: Secondary | ICD-10-CM

## 2021-12-14 ENCOUNTER — Encounter: Payer: Self-pay | Admitting: Adult Health

## 2021-12-14 ENCOUNTER — Ambulatory Visit (INDEPENDENT_AMBULATORY_CARE_PROVIDER_SITE_OTHER): Payer: Medicare Other

## 2021-12-14 ENCOUNTER — Ambulatory Visit: Payer: Medicare Other | Admitting: Adult Health

## 2021-12-14 VITALS — BP 140/70 | HR 87 | Temp 98.0°F | Ht 58.5 in | Wt 97.4 lb

## 2021-12-14 DIAGNOSIS — J479 Bronchiectasis, uncomplicated: Secondary | ICD-10-CM | POA: Diagnosis not present

## 2021-12-14 DIAGNOSIS — J4530 Mild persistent asthma, uncomplicated: Secondary | ICD-10-CM | POA: Diagnosis not present

## 2021-12-14 MED ORDER — AZITHROMYCIN 250 MG PO TABS: ORAL_TABLET | ORAL | 0 refills | Status: AC

## 2021-12-14 NOTE — Patient Instructions (Addendum)
Chest xray today .  Zpack take as directed . Take with food.  Mucinex DM liquid Twice daily As needed   Symbicort 80 2 puffs Twice daily  , rinse after use.  Albuterol Inhaler 2 puffs every 4hr as needed Flutter valve  Twice daily  As needed   Flu shot this fall.  Follow up with Dr. Marchelle Gearing or Sadhana Frater NP in 1 year and As needed   Please contact office for sooner follow up if symptoms do not improve or worsen or seek emergency care

## 2021-12-14 NOTE — Progress Notes (Signed)
@Patient  ID: , female    DOB: 02/04/1951, 71 y.o.   MRN: [id number]  Chief Complaint  Patient presents with   Follow-up    Referring provider: [id number], MD  HPI: 71 year old female never smoker followed for mild persistent asthma and bronchiectasis  TEST/EVENTS :  HRCT chest that was neg for ILD. Mild Bronchiectasis/RML scarring. And extensive air trapping throughout lungs indicating small airways dz., 2 incidental LLL nodules 4 mm .    Methacholine challenge test was normal.   Patient had pulmonary function testing 07/2019 that showed normal lung function with no airflow restriction or obstruction.  FEV1 90%, ratio 82, FVC 83%, no significant bronchodilator response.,  Positive mid flow bronchodilator response.  DLCO is normal    12/14/2021 Follow up : Asthma and Bronchiectasis  Patient presents for a 1 year follow-up.  Patient says overall she has been doing well.  She complains over the last 2 weeks she has been having increased cough congestion with thick green mucus.  She did test for COVID-19 at home and was negative.  Patient remains on Symbicort twice daily. She denies any increased albuterol use.  She has not been using her flutter valve.  She says she has been very busy and has had a wonderful summer with her grandchildren.  She denies any hemoptysis, chest pain, orthopnea, nausea vomiting or diarrhea. She says she has been very active.  No Known Allergies  Immunization History  Administered Date(s) Administered   Influenza, High Dose Seasonal PF 02/10/2021   Influenza,inj,Quad PF,6+ Mos 12/22/2014   Influenza-Unspecified 02/15/2019   Janssen (J&J) SARS-COV-2 Vaccination 08/02/2019   Tdap 09/03/2009   Zoster Recombinat (Shingrix) 05/07/2018, 09/27/2018   Zoster, Live 07/17/2018    Past Medical History:  Diagnosis Date   Asthma    Fibromyalgia    Insomnia    OA (osteoarthritis)    Positive PPD    Took INH tabs x 3 months    Tobacco  History: Social History   Tobacco Use  Smoking Status Never  Smokeless Tobacco Never   Counseling given: Not Answered   Outpatient Medications Prior to Visit  Medication Sig Dispense Refill   albuterol (VENTOLIN HFA) 108 (90 Base) MCG/ACT inhaler Inhale 2 puffs into the lungs every 6 (six) hours as needed for wheezing or shortness of breath. Insurance preference 18 g 2   Biotin 5 MG TABS Take by mouth daily.     budesonide-formoterol (SYMBICORT) 80-4.5 MCG/ACT inhaler Inhale 2 puffs into the lungs 2 (two) times daily. 10.2 g 0   Calcium Carbonate-Vitamin D (CALCIUM-VITAMIN D) 500-200 MG-UNIT tablet Take 1 tablet by mouth. 2 TO 3 TIMES WEEKLY     Cholecalciferol (VITAMIN D PO) Take 1 tablet by mouth daily.     colchicine 0.6 MG tablet colchicine Take 2 Tablet (oral) 1 time per day for 1 days For 1st Dose; repeat with 1 po in 1 hour for second dose. 07/19/2018 tablet 1 time per day oral 1 days active 0.6 mg     cycloSPORINE (RESTASIS) 0.05 % ophthalmic emulsion Place 1 drop into both eyes 2 (two) times daily.     diclofenac sodium (VOLTAREN) 1 % GEL Apply 3 grams to 3 large joints up to 3 times daily 3 Tube 3   estradiol (ESTRACE) 1 MG tablet Take 1 mg by mouth daily.     Eszopiclone 3 MG TABS TAKE 1 TABLET BY MOUTH AT BEDTIME 30 tablet 5   Fluticasone Propionate (FLONASE ALLERGY RELIEF NA)  Place into the nose.     Magnesium 250 MG TABS Take by mouth every other day.      Multiple Vitamins-Minerals (MULTIVITAMIN WITH MINERALS) tablet Take 1 tablet by mouth daily.     pregabalin (LYRICA) 25 MG capsule TAKE ONE CAPSULE BY MOUTH TWICE A DAY 60 capsule 5   Respiratory Therapy Supplies (FLUTTER) DEVI Use as directed 1 each 0   traMADol (ULTRAM) 50 MG tablet TAKE 1 TABLET(50 MG) BY MOUTH TWICE DAILY 60 tablet 3   vitamin C (ASCORBIC ACID) 500 MG tablet Take 500 mg by mouth daily.     temazepam (RESTORIL) 15 MG capsule Take 1 capsule (15 mg total) by mouth at bedtime as needed for sleep. (Patient  not taking: Reported on 12/14/2021) 30 capsule 4   No facility-administered medications prior to visit.     Review of Systems:   Constitutional:   No  weight loss, night sweats,  Fevers, chills,  +fatigue, or  lassitude.  HEENT:   No headaches,  Difficulty swallowing,  Tooth/dental problems, or  Sore throat,                No sneezing, itching, ear ache, nasal congestion, post nasal drip,   CV:  No chest pain,  Orthopnea, PND, swelling in lower extremities, anasarca, dizziness, palpitations, syncope.   GI  No heartburn, indigestion, abdominal pain, nausea, vomiting, diarrhea, change in bowel habits, loss of appetite, bloody stools.   Resp: .  No chest wall deformity  Skin: no rash or lesions.  GU: no dysuria, change in color of urine, no urgency or frequency.  No flank pain, no hematuria   MS:  No joint pain or swelling.  No decreased range of motion.  No back pain.    Physical Exam  BP (!) 140/70 (BP Location: Left Arm, Patient Position: Sitting, Cuff Size: Normal)   Pulse 87   Temp 98 F (36.7 C) (Oral)   Ht 4' 10.5" (1.486 m)   Wt 97 lb 6.4 oz (44.2 kg)   SpO2 97%   BMI 20.01 kg/m   GEN: A/Ox3; pleasant , NAD, well nourished    HEENT:  [place]/AT,   NOSE-clear, THROAT-clear, no lesions, no postnasal drip or exudate noted.   NECK:  Supple w/ fair ROM; no JVD; normal carotid impulses w/o bruits; no thyromegaly or nodules palpated; no lymphadenopathy.    RESP few trace rhonchi. no accessory muscle use, no dullness to percussion  CARD:  RRR, no m/r/g, no peripheral edema, pulses intact, no cyanosis or clubbing.  GI:   Soft & nt; nml bowel sounds; no organomegaly or masses detected.   Musco: Warm bil, no deformities or joint swelling noted.   Neuro: alert, no focal deficits noted.    Skin: Warm, no lesions or rashes    Lab Results:   BMET   BNP No results found for: "BNP"  ProBNP No results found for: "PROBNP"  Imaging: No results found.        Latest Ref Rng & Units 08/12/2019   10:03 AM 06/29/2015   12:51 PM 06/28/2015   12:45 PM  PFT Results  FVC-Pre L 2.05  2.33  2.24   FVC-Predicted Pre % 82  90  86   FVC-Post L 2.08  2.17  2.27   FVC-Predicted Post % 83  83  87   Pre FEV1/FVC % % 80  78  82   Post FEV1/FCV % % 82  83  80   FEV1-Pre L  1.63  1.82  1.83   FEV1-Predicted Pre % 86  91  92   FEV1-Post L 1.72  1.80  1.83   DLCO uncorrected ml/min/mmHg 18.70  18.28    DLCO UNC% % 115  108    DLCO corrected ml/min/mmHg 18.70     DLCO COR %Predicted % 115     DLVA Predicted % 132  132    TLC L 4.20  4.07    TLC % Predicted % 99  96    RV % Predicted % 112  98      Lab Results  Component Value Date   NITRICOXIDE 15 06/16/2015        Assessment & Plan:   Bronchiectasis (HCC) Acute bronchitis versus mild flare.  We will treat with a Z-Pak. Increase mucociliary clearance with flutter valve and mucinex   Plan  Patient Instructions  Chest xray today .  Zpack take as directed . Take with food.  Mucinex DM liquid Twice daily As needed   Symbicort 80 2 puffs Twice daily  , rinse after use.  Albuterol Inhaler 2 puffs every 4hr as needed Flutter valve  Twice daily  As needed   Flu shot this fall.  Follow up with Dr. Chase Caller or Carmel Waddington NP in 1 year and As needed   Please contact office for sooner follow up if symptoms do not improve or worsen or seek emergency care      Asthma Mild flare   Plan  Patient Instructions  Chest xray today .  Zpack take as directed . Take with food.  Mucinex DM liquid Twice daily As needed   Symbicort 80 2 puffs Twice daily  , rinse after use.  Albuterol Inhaler 2 puffs every 4hr as needed Flutter valve  Twice daily  As needed   Flu shot this fall.  Follow up with Dr. Chase Caller or Avannah Decker NP in 1 year and As needed   Please contact office for sooner follow up if symptoms do not improve or worsen or seek emergency care        Rexene Edison, NP 12/14/2021

## 2021-12-14 NOTE — Assessment & Plan Note (Signed)
Acute bronchitis versus mild flare.  We will treat with a Z-Pak. Increase mucociliary clearance with flutter valve and mucinex   Plan  Patient Instructions  Chest xray today .  Zpack take as directed . Take with food.  Mucinex DM liquid Twice daily As needed   Symbicort 80 2 puffs Twice daily  , rinse after use.  Albuterol Inhaler 2 puffs every 4hr as needed Flutter valve  Twice daily  As needed   Flu shot this fall.  Follow up with Dr. Marchelle Gearing or Mattalynn Crandle NP in 1 year and As needed   Please contact office for sooner follow up if symptoms do not improve or worsen or seek emergency care

## 2021-12-14 NOTE — Assessment & Plan Note (Signed)
Mild flare   Plan  Patient Instructions  Chest xray today .  Zpack take as directed . Take with food.  Mucinex DM liquid Twice daily As needed   Symbicort 80 2 puffs Twice daily  , rinse after use.  Albuterol Inhaler 2 puffs every 4hr as needed Flutter valve  Twice daily  As needed   Flu shot this fall.  Follow up with Dr. Marchelle Gearing or Conlan Miceli NP in 1 year and As needed   Please contact office for sooner follow up if symptoms do not improve or worsen or seek emergency care

## 2021-12-19 NOTE — Progress Notes (Signed)
ATC x1.  Left detailed message letting her know the results and advised to call with any questions.

## 2021-12-20 NOTE — Progress Notes (Signed)
Called and spoke with patient, she received my vm and is feeling better.  She has no questions.

## 2021-12-22 ENCOUNTER — Encounter
Payer: Medicare Other | Attending: Physical Medicine and Rehabilitation | Admitting: Physical Medicine and Rehabilitation

## 2021-12-22 VITALS — BP 133/76 | HR 84 | Ht 58.5 in | Wt 98.8 lb

## 2021-12-22 DIAGNOSIS — M7918 Myalgia, other site: Secondary | ICD-10-CM | POA: Diagnosis present

## 2021-12-22 MED ORDER — LIDOCAINE HCL 1 % IJ SOLN
3.0000 mL | Freq: Once | INTRAMUSCULAR | Status: AC
  Administered 2021-12-22: 3 mL

## 2021-12-22 MED ORDER — SODIUM CHLORIDE (PF) 0.9 % IJ SOLN
2.0000 mL | Freq: Once | INTRAMUSCULAR | Status: AC
  Administered 2021-12-22: 2 mL

## 2021-12-22 NOTE — Progress Notes (Signed)
Trigger Point Injection ° °Indication: Cervical myofascial pain not relieved by medication management and other conservative care. ° °Informed consent was obtained after describing risk and benefits of the procedure with the patient, this includes bleeding, bruising, infection and medication side effects.  The patient wishes to proceed and has given written consent.  The patient was placed in a seated position.  The area of pain was marked and prepped with Betadine.  It was entered with a 25-gauge 1/2 inch needle and a total of 5 mL of 1% lidocaine and normal saline was injected into a total of 6 trigger points, after negative draw back for blood.  The patient tolerated the procedure well.  Post procedure instructions were given.  °

## 2022-03-09 ENCOUNTER — Other Ambulatory Visit: Payer: Self-pay | Admitting: Physical Medicine and Rehabilitation

## 2022-03-09 ENCOUNTER — Telehealth: Payer: Self-pay | Admitting: Physical Medicine and Rehabilitation

## 2022-03-09 MED ORDER — PREGABALIN 25 MG PO CAPS: ORAL_CAPSULE | ORAL | 5 refills | Status: DC

## 2022-03-09 NOTE — Telephone Encounter (Signed)
Please send script for refill on  Pregabilin, send to [facility]

## 2022-03-10 ENCOUNTER — Telehealth: Payer: Self-pay | Admitting: Physical Medicine and Rehabilitation

## 2022-03-10 NOTE — Telephone Encounter (Signed)
Notified med sent to pharmacy.

## 2022-03-10 NOTE — Telephone Encounter (Signed)
Per pharmacy script was not received. Please send again.

## 2022-03-21 ENCOUNTER — Telehealth: Payer: Self-pay | Admitting: *Deleted

## 2022-03-21 NOTE — Telephone Encounter (Signed)
Victoria Watson called for a refill on her tramadol to [facility] on [address].

## 2022-03-22 ENCOUNTER — Other Ambulatory Visit: Payer: Self-pay | Admitting: Physical Medicine and Rehabilitation

## 2022-03-22 DIAGNOSIS — M545 Low back pain, unspecified: Secondary | ICD-10-CM

## 2022-03-22 MED ORDER — TRAMADOL HCL 50 MG PO TABS: ORAL_TABLET | ORAL | 3 refills | Status: DC

## 2022-03-22 NOTE — Telephone Encounter (Signed)
Second request for refill

## 2022-04-10 ENCOUNTER — Other Ambulatory Visit: Payer: Self-pay | Admitting: Adult Health

## 2022-05-20 ENCOUNTER — Other Ambulatory Visit: Payer: Self-pay | Admitting: Adult Health

## 2022-06-01 ENCOUNTER — Other Ambulatory Visit: Payer: Self-pay | Admitting: Physical Medicine and Rehabilitation

## 2022-06-01 DIAGNOSIS — G4709 Other insomnia: Secondary | ICD-10-CM

## 2022-06-22 ENCOUNTER — Encounter: Payer: Self-pay | Admitting: Physical Medicine and Rehabilitation

## 2022-06-22 ENCOUNTER — Encounter
Payer: Medicare Other | Attending: Physical Medicine and Rehabilitation | Admitting: Physical Medicine and Rehabilitation

## 2022-06-22 VITALS — BP 119/74 | HR 83 | Ht <= 58 in | Wt 100.6 lb

## 2022-06-22 DIAGNOSIS — Z730 Burn-out: Secondary | ICD-10-CM

## 2022-06-22 DIAGNOSIS — G4701 Insomnia due to medical condition: Secondary | ICD-10-CM

## 2022-06-22 DIAGNOSIS — M7918 Myalgia, other site: Secondary | ICD-10-CM | POA: Diagnosis present

## 2022-06-22 DIAGNOSIS — M797 Fibromyalgia: Secondary | ICD-10-CM | POA: Diagnosis present

## 2022-06-22 MED ORDER — LIDOCAINE HCL 1 % IJ SOLN: 3.0000 mL | Freq: Once | INTRAMUSCULAR | Status: DC

## 2022-06-22 MED ORDER — SODIUM CHLORIDE (PF) 0.9 % IJ SOLN: 2.0000 mL | Freq: Once | INTRAMUSCULAR | Status: AC

## 2022-06-22 NOTE — Progress Notes (Signed)
Trigger Point Injection  Indication: Cervical myofascial pain not relieved by medication management and other conservative care.  Informed consent was obtained after describing risk and benefits of the procedure with the patient, this includes bleeding, bruising, infection and medication side effects.  The patient wishes to proceed and has given written consent.  The patient was placed in a seated position.  The area of pain was marked and prepped with Betadine.  It was entered with a 25-gauge 1/2 inch needle and a total of 5 mL of 1% lidocaine and normal saline was injected into a total of 8 trigger points, after negative draw back for blood.  The patient tolerated the procedure well.  Post procedure instructions were given.

## 2022-06-22 NOTE — Progress Notes (Signed)
Subjective:    Patient ID: Victoria Watson, female    DOB: [birth year], 72 y.o.   MRN: [id number]  HPI  Victoria Watson is a 72 year old woman who presents for follow-up of fibromyalgia and low back pain, myofascial pain  1) Low back pain has worsened: -She has been having worsening low back pain recently -She feels it is from lifting her grandchildren  -She has asked them to come on her lap instead -she has been busy over Christmas  2) Cervical myofascial pain: -has been getting massage -ready for trigger point injection today -she is having a lot of pain on the right side of her neck- her husband was concerned -she took her tramadol '50mg'$  BID -She took her lyrica at night -she used to the trigger point injections before Christmas  3) Fibromyalgia: -She is currently taking Tramadol twice per day, does not need refills today -worsened due to stress from her mother's condition.  -Her pain has been better controlled with the Lyrica and she not needs as much as Tylenol. Continues to take '25mg'$  Lyrica at night and does not require a higher dose -pai has been a little increased due to her   4) Insomnia Her sleep has worsened, she thinks due to her mother's illness.  -Victoria Watson has been helping  5) Right hand third digit swollen joint -she has been trying to move it a lot -causes pain that radiates down through finger -she as been using voltaren gel  Prior history:  She has seen Victoria Watson since 2009 and has been on Tramadol '50mg'$  BID since this time. She has been trying to take one per day since she was told in August that Victoria Watson office would no longer be able to prescribe. It helps her get up and down the steps, do laundry, and to play with her grandchildren.   Her husband took her to a holistic provider and she had told her not to try Lyrica so she has never tried this.   She went to anaerobic dance class in the spring. She mainly exercises with her grandchildren. She  walks twice a week in the morning. Also enjoys dancing and playing with her grandchildren.  She sleeps poorly at night- difficulty falling asleep. She takes [place].   Pain Inventory Average Pain 9 Pain Right Now 9 My pain is tingling, aching, and tender to touch  In the last 24 hours, has pain interfered with the following? General activity 4 Relation with others 0 Enjoyment of life 0 What TIME of day is your pain at its worst? morning , evening, and night Sleep (in general) Fair  Pain is worse with: bending and some activites Pain improves with: rest, heat/ice, therapy/exercise, medication, and heat Relief from Meds: 8    Family History  Problem Relation Age of Onset   Heart disease Father    COPD Father    Heart disease Paternal Grandmother    Heart disease Paternal Grandfather    Social History   Socioeconomic History   Marital status: Married    Spouse name: Not on file   Number of children: Not on file   Years of education: Not on file   Highest education level: Not on file  Occupational History   Occupation: retired  Tobacco Use   Smoking status: Never   Smokeless tobacco: Never  Vaping Use   Vaping Use: Never used  Substance and Sexual Activity   Alcohol use: No    Alcohol/week: 0.0 standard  drinks of alcohol   Drug use: No   Sexual activity: Not on file  Other Topics Concern   Not on file  Social History Narrative   Not on file   Social Determinants of Health   Financial Resource Strain: Not on file  Food Insecurity: Not on file  Transportation Needs: Not on file  Physical Activity: Not on file  Stress: Not on file  Social Connections: Not on file   Past Surgical History:  Procedure Laterality Date   TONSILLECTOMY     tooth implant Left    TOTAL ABDOMINAL HYSTERECTOMY  1990   Past Medical History:  Diagnosis Date   Asthma    Fibromyalgia    Insomnia    OA (osteoarthritis)    Positive PPD    Took INH tabs x 3 months   BP 119/74    Pulse 83   Ht '4\' 10"'$  (1.473 m)   Wt 100 lb 9.6 oz (45.6 kg)   SpO2 97%   BMI 21.03 kg/m   Opioid Risk Score:   Fall Risk Score:  `1  Depression screen [facility] 2/9     06/22/2022   11:40 AM 09/05/2021    1:46 PM 05/16/2021   10:17 AM 04/22/2021    2:17 PM 12/24/2020    3:33 PM 02/20/2020    3:32 PM 01/23/2020   10:59 AM  Depression screen PHQ 2/9  Decreased Interest 0 0 0 0 0 0 1  Down, Depressed, Hopeless 0 0 0 0 0 0 0  PHQ - 2 Score 0 0 0 0 0 0 1  Altered sleeping       3  Tired, decreased energy       3  Change in appetite       0  Feeling bad or failure about yourself        0  Trouble concentrating       1  Moving slowly or fidgety/restless       1  Suicidal thoughts       0  PHQ-9 Score       9  Difficult doing work/chores       Somewhat difficult    Review of Systems  Constitutional: Negative.   HENT: Negative.    Eyes: Negative.   Respiratory: Negative.    Cardiovascular: Negative.   Gastrointestinal: Negative.   Endocrine: Negative.   Genitourinary: Negative.   Musculoskeletal:  Positive for arthralgias, back pain and neck pain.       Left upper back pain  Skin: Negative.   Allergic/Immunologic: Negative.   Neurological: Negative.   Hematological: Negative.   Psychiatric/Behavioral: Negative.    All other systems reviewed and are negative.      Objective:   Physical Exam Gen: no distress, normal appearing HEENT: oral mucosa pink and moist, NCAT Cardio: Reg rate Chest: normal effort, normal rate of breathing Abd: soft, non-distended Ext: no edema Psych: pleasant, normal affect Skin: intact Neuro: Alert and oriented x3.  Musculoskeletal:Diffuse tenderness throughout the body, upper and lower extremities, bilaterally, trigger point to right splenius capitus and along bilateral trapezius muscles Psych: pleasant, normal affect     Assessment & Plan:  Victoria Watson is a 72 year old woman who presents for follow-up of fibromyalgia.   1)  Fibromyalgia -trigger point injections did not provide benefit, will not repeat.  -discussed role of stress in worsening symptoms -made goal to walk three times weekly in the morning- walks with neighbor Victoria Watson  Lyrica '25mg'$  HS -Advised that Lyrica is better medication than Tramadol for fibromyalgia, but will prescribe Tramadol if it is helping her functionally. She will call or text when she needs a refill. For now, continue Tramadol '50mg'$  BID. Routine UDS obtained today. -Goal is to have improved energy and be able to play with her grandchildren, which she loves. -Recommended reading the Microbiome diet and following strict no sugar/no gluten/no eggs/no dairy diet for 3 weeks: can eat unlimited health fats (grass-fed butter, olive oil, olives, avocados), vegetables, fruits, fish, grass-fed meat.   2) Insomnia -continue Lunesta -Advised regarding daily walk outside during sunrise to reset circadian rhythm. -Lyrica should also help with sleep- may no longer need Lunesta. Insomnia: -Try to go outside near sunrise -Get exercise during the day.  -Discussed good sleep hygiene: turning off all devices an hour before bedtime.  -Chamomile tea with dinner.  -Can consider over the counter melatonin  3) Lumbar facet arthropathy: -core and paraspinal strengthening -can continue voltaren gel, advised that she can try blue emu oil  -avoid extension based exercises.   4) Lumbago: -likely secondary to bending and lifting her grandkids -discussed asking someone else to put them in her arms or using a squat position to lift them -recommended heat to lower back to help relax muscles.  -continue turmeric -continue massage chair  5) OA of hands -apply voltaren gel  6) Cervical myofascial pain syndrome: -Cervical collar for kinesthetic reminder through sensory feedback to limit motion, and to retain body heat to reduce muscle spasm. Should wear for maximum of 10 days to minimize  dependency/muscle weakness.   -discussed PT and OT -heat  -discussed muscle relaxers --Provided with a pain relief journal and discussed that it contains foods and lifestyle tips to naturally help to improve pain. Discussed that these lifestyle strategies are also very good for health unlike some medications which can have negative side effects. Discussed that the act of keeping a journal can be therapeutic and helpful to realize patterns what helps to trigger and alleviate pain. -NAC '600mg'$  BID  -apply lavender.     7) Caregiver burnout: -discussed her challenges with her mother's progressive dementia -recommended reaching out to her mother's PCP regarding getting a home health aide for her mom given the burden of her incontinence -discussed the medications her mother is currently on   All questions answered. RTC in 6 months

## 2022-06-28 ENCOUNTER — Telehealth: Payer: Self-pay | Admitting: Adult Health

## 2022-06-28 NOTE — Telephone Encounter (Signed)
Needs a prescription for Generic Symbicort as the Brand name is no longer covered  Pharmacy: [facility] on [address]

## 2022-06-30 MED ORDER — BUDESONIDE-FORMOTEROL FUMARATE 80-4.5 MCG/ACT IN AERO: 2.0000 | INHALATION_SPRAY | Freq: Two times a day (BID) | RESPIRATORY_TRACT | 3 refills | Status: DC

## 2022-06-30 NOTE — Telephone Encounter (Signed)
Pt returning missed call. 

## 2022-06-30 NOTE — Telephone Encounter (Signed)
ATC patient. LVMTCB. 

## 2022-06-30 NOTE — Telephone Encounter (Signed)
Rx for Symbicort has been resent and specified for it to be generic. Called and spoke with pt letting her know this info and she verbalized understanding. Nothing further needed.

## 2022-07-07 ENCOUNTER — Other Ambulatory Visit: Payer: Self-pay | Admitting: Physical Medicine and Rehabilitation

## 2022-07-17 ENCOUNTER — Other Ambulatory Visit: Payer: Self-pay | Admitting: Physical Medicine and Rehabilitation

## 2022-07-17 DIAGNOSIS — M545 Low back pain, unspecified: Secondary | ICD-10-CM

## 2022-10-02 ENCOUNTER — Other Ambulatory Visit: Payer: Self-pay | Admitting: Physical Medicine and Rehabilitation

## 2022-10-02 DIAGNOSIS — G4709 Other insomnia: Secondary | ICD-10-CM

## 2022-10-24 MED ORDER — AZITHROMYCIN 250 MG PO TABS: 250.0000 mg | ORAL_TABLET | ORAL | 0 refills | Status: DC

## 2022-10-24 NOTE — Telephone Encounter (Signed)
Called and spoke with the pt  She is c/o increased cough x 3 days- prod with green sputum  She states no other co's  Denies any wheezing, increased SOB, chest tightness, fevers, aches  Her appetite is good  She is scheduled for f/u here on 12/15/22  She asks for zpack as this helped last year when had same symptom  Please advise, thanks!  No Known Allergies

## 2022-10-24 NOTE — Telephone Encounter (Signed)
I personally not seen her since 2017.  Even then I saw her only 1 time.  Saw Rikki Spearing in August 2023.  Diagnose of asthma and bronchiectasis.  Appears to be having a little flareup right now/acute bronchitis.  Plan - Support Z-Pak -Also if she is interested, Please take prednisone 40 mg x1 day, then 30 mg x1 day, then 20 mg x1 day, then 10 mg x1 day, and then 5 mg x1 day and stop

## 2022-11-13 ENCOUNTER — Other Ambulatory Visit: Payer: Self-pay | Admitting: Physical Medicine and Rehabilitation

## 2022-11-16 ENCOUNTER — Other Ambulatory Visit: Payer: Self-pay | Admitting: Physical Medicine and Rehabilitation

## 2022-11-16 DIAGNOSIS — M545 Low back pain, unspecified: Secondary | ICD-10-CM

## 2022-12-15 ENCOUNTER — Ambulatory Visit: Payer: Medicare Other | Admitting: Adult Health

## 2022-12-15 ENCOUNTER — Encounter: Payer: Self-pay | Admitting: Adult Health

## 2022-12-15 VITALS — BP 124/82 | HR 89 | Temp 98.2°F | Ht 58.5 in | Wt 102.2 lb

## 2022-12-15 DIAGNOSIS — J4530 Mild persistent asthma, uncomplicated: Secondary | ICD-10-CM

## 2022-12-15 DIAGNOSIS — J479 Bronchiectasis, uncomplicated: Secondary | ICD-10-CM

## 2022-12-15 NOTE — Progress Notes (Signed)
@Patient  ID: Victoria Watson, female    DOB: 1950-10-11, 72 y.o.   MRN: [id number]  Chief Complaint  Patient presents with   Follow-up    Referring provider: Maurice Small, MD  HPI: 72 year old female never smoker followed for mild persistent asthma and bronchiectasis  TEST/EVENTS :  HRCT chest that was neg for ILD. Mild Bronchiectasis/RML scarring. And extensive air trapping throughout lungs indicating small airways dz., 2 incidental LLL nodules 4 mm .    Methacholine challenge test was normal.   Patient had pulmonary function testing 07/2019 that showed normal lung function with no airflow restriction or obstruction.  FEV1 90%, ratio 82, FVC 83%, no significant bronchodilator response.,  Positive mid flow bronchodilator response.  DLCO is normal  12/15/2022 Follow up ; Asthma and Bronchiectasis  Patient returns for a 1 year follow-up.  She says overall she has been doing well since her last visit.  She is followed for mild persistent asthma and bronchiectasis.  She says over the last year she has had 1 flareup of bronchitis.  She was treated with a Z-Pak in early July says her symptoms improved pretty quickly.  She does her flutter valve at least once daily.  She remains very active.  She helps with her grandchildren on a regular basis.  She says she has no shortness of breath.  She denies any weight loss, hemoptysis, chest pain.  Has minimum cough.  Chest x-ray last visit in August 2023 showed clear lungs.  Previous PFTs in 2021 were normal.  She says she has had her pneumonia vaccines at her primary care provider.  We did discuss the flu shot and RSV vaccine for this fall. She does remain on Symbicort twice daily.  She says she has got the generic form of off Symbicort says the inhaler device is not quite as strong.  We discussed adding a spacer to see if this would help.    No Known Allergies  Immunization History  Administered Date(s) Administered   Influenza, High Dose  Seasonal PF 02/10/2021   Influenza,inj,Quad PF,6+ Mos 12/22/2014   Influenza-Unspecified 02/15/2019   Janssen (J&J) SARS-COV-2 Vaccination 08/02/2019   Tdap 09/03/2009   Zoster Recombinant(Shingrix) 05/07/2018, 09/27/2018   Zoster, Live 07/17/2018    Past Medical History:  Diagnosis Date   Asthma    Fibromyalgia    Insomnia    OA (osteoarthritis)    Positive PPD    Took INH tabs x 3 months    Tobacco History: Social History   Tobacco Use  Smoking Status Never   Passive exposure: Past  Smokeless Tobacco Never   Counseling given: Not Answered   Outpatient Medications Prior to Visit  Medication Sig Dispense Refill   albuterol (VENTOLIN HFA) 108 (90 Base) MCG/ACT inhaler Inhale 2 puffs into the lungs every 6 (six) hours as needed for wheezing or shortness of breath. Insurance preference 18 g 2   Biotin 5 MG TABS Take by mouth daily.     budesonide-formoterol (BREYNA) 80-4.5 MCG/ACT inhaler Inhale 2 puffs into the lungs 2 (two) times daily.     Calcium Carbonate-Vitamin D (CALCIUM-VITAMIN D) 500-200 MG-UNIT tablet Take 1 tablet by mouth. 2 TO 3 TIMES WEEKLY     Cholecalciferol (VITAMIN D PO) Take 1 tablet by mouth daily.     cycloSPORINE (RESTASIS) 0.05 % ophthalmic emulsion Place 1 drop into both eyes 2 (two) times daily.     diclofenac sodium (VOLTAREN) 1 % GEL Apply 3 grams to 3 large joints up  to 3 times daily 3 Tube 3   estradiol (ESTRACE) 1 MG tablet Take 1 mg by mouth daily.     Eszopiclone 3 MG TABS TAKE 1 TABLET BY MOUTH AT BEDTIME 30 tablet 3   Fluticasone Propionate (FLONASE ALLERGY RELIEF NA) Place into the nose.     pregabalin (LYRICA) 25 MG capsule TAKE 1 CAPSULE BY MOUTH TWICE DAILY 60 capsule 3   Respiratory Therapy Supplies (FLUTTER) DEVI Use as directed 1 each 0   traMADol (ULTRAM) 50 MG tablet TAKE 1 TABLET(50 MG) BY MOUTH TWICE DAILY 60 tablet 3   vitamin C (ASCORBIC ACID) 500 MG tablet Take 500 mg by mouth daily.     budesonide-formoterol (SYMBICORT)  80-4.5 MCG/ACT inhaler Inhale 2 puffs into the lungs 2 (two) times daily. (Patient not taking: Reported on 12/15/2022) 10.2 g 3   colchicine 0.6 MG tablet colchicine Take 2 Tablet (oral) 1 time per day for 1 days For 1st Dose; repeat with 1 po in 1 hour for second dose. [id number] tablet 1 time per day oral 1 days active 0.6 mg (Patient not taking: Reported on 12/15/2022)     Multiple Vitamins-Minerals (MULTIVITAMIN WITH MINERALS) tablet Take 1 tablet by mouth daily. (Patient not taking: Reported on 12/15/2022)     azithromycin (ZITHROMAX) 250 MG tablet Take 1 tablet (250 mg total) by mouth as directed. (Patient not taking: Reported on 12/15/2022) 6 tablet 0   Magnesium 250 MG TABS Take by mouth every other day.  (Patient not taking: Reported on 12/15/2022)     Facility-Administered Medications Prior to Visit  Medication Dose Route Frequency Provider Last Rate Last Admin   lidocaine (XYLOCAINE) 1 % (with pres) injection 3 mL  3 mL Other Once Raulkar, Drema Pry, MD       sodium chloride (PF) 0.9 % injection 2 mL  2 mL Other Once Raulkar, Drema Pry, MD         Review of Systems:   Constitutional:   No  weight loss, night sweats,  Fevers, chills, fatigue, or  lassitude.  HEENT:   No headaches,  Difficulty swallowing,  Tooth/dental problems, or  Sore throat,                No sneezing, itching, ear ache, nasal congestion, post nasal drip,   CV:  No chest pain,  Orthopnea, PND, swelling in lower extremities, anasarca, dizziness, palpitations, syncope.   GI  No heartburn, indigestion, abdominal pain, nausea, vomiting, diarrhea, change in bowel habits, loss of appetite, bloody stools.   Resp:   No chest wall deformity  Skin: no rash or lesions.  GU: no dysuria, change in color of urine, no urgency or frequency.  No flank pain, no hematuria   MS:  No joint pain or swelling.  No decreased range of motion.  No back pain.    Physical Exam  BP 124/82 (BP Location: Left Arm, Patient Position:  Sitting, Cuff Size: Normal)   Pulse 89   Temp 98.2 F (36.8 C) (Oral)   Ht 4' 10.5" (1.486 m)   Wt 102 lb 3.2 oz (46.4 kg)   SpO2 98%   BMI 21.00 kg/m   GEN: A/Ox3; pleasant , NAD, well nourished    HEENT:  [place]/AT,   NOSE-clear, THROAT-clear, no lesions, no postnasal drip or exudate noted.   NECK:  Supple w/ fair ROM; no JVD; normal carotid impulses w/o bruits; no thyromegaly or nodules palpated; no lymphadenopathy.    RESP  Trace rhonchi  no  accessory muscle use, no dullness to percussion  CARD:  RRR, no m/r/g, no peripheral edema, pulses intact, no cyanosis or clubbing.  GI:   Soft & nt; nml bowel sounds; no organomegaly or masses detected.   Musco: Warm bil, no deformities or joint swelling noted.   Neuro: alert, no focal deficits noted.    Skin: Warm, no lesions or rashes    Lab Results:   BNP No results found for: "BNP"  ProBNP No results found for: "PROBNP"  Imaging: No results found.  Administration History     None          Latest Ref Rng & Units 08/12/2019   10:03 AM 06/29/2015   12:51 PM 06/28/2015   12:45 PM  PFT Results  FVC-Pre L 2.05  2.33  2.24   FVC-Predicted Pre % 82  90  86   FVC-Post L 2.08  2.17  2.27   FVC-Predicted Post % 83  83  87   Pre FEV1/FVC % % 80  78  82   Post FEV1/FCV % % 82  83  80   FEV1-Pre L 1.63  1.82  1.83   FEV1-Predicted Pre % 86  91  92   FEV1-Post L 1.72  1.80  1.83   DLCO uncorrected ml/min/mmHg 18.70  18.28    DLCO UNC% % 115  108    DLCO corrected ml/min/mmHg 18.70     DLCO COR %Predicted % 115     DLVA Predicted % 132  132    TLC L 4.20  4.07    TLC % Predicted % 99  96    RV % Predicted % 112  98      Lab Results  Component Value Date   NITRICOXIDE 15 06/16/2015        Assessment & Plan:   Bronchiectasis (HCC) Bronchiectasis appears to be stable.  Encouraged on mucociliary clearance with flutter valve at least once or twice daily.  Will check PFTs on return.  Chest x-ray last year showed  clear lungs.  Will repeat chest x-ray on return visit.  Activity as tolerated.  Plan  Patient Instructions  Mucinex DM liquid Twice daily As needed   Symbicort 80 2 puffs Twice daily  , rinse after use. -add spacer  Albuterol Inhaler 2 puffs every 4hr as needed Flutter valve  Twice daily  As needed   Flu shot and RSV vaccine this fall as discussed.  Follow up with Dr. Marchelle Gearing or Gabor Lusk NP in 1 year with chest xray and PFT  and As needed   Please contact office for sooner follow up if symptoms do not improve or worsen or seek emergency care     Asthma Mild persistent asthma appears under good control.  Asthma action plan discussed.  Will add in spacer to her generic Symbicort inhaler Oral care discussed Plan  Patient Instructions  Mucinex DM liquid Twice daily As needed   Symbicort 80 2 puffs Twice daily  , rinse after use. -add spacer  Albuterol Inhaler 2 puffs every 4hr as needed Flutter valve  Twice daily  As needed   Flu shot and RSV vaccine this fall as discussed.  Follow up with Dr. Marchelle Gearing or Roselani Grajeda NP in 1 year with chest xray and PFT  and As needed   Please contact office for sooner follow up if symptoms do not improve or worsen or seek emergency care      Rubye Oaks, NP 12/15/2022

## 2022-12-15 NOTE — Assessment & Plan Note (Signed)
Bronchiectasis appears to be stable.  Encouraged on mucociliary clearance with flutter valve at least once or twice daily.  Will check PFTs on return.  Chest x-ray last year showed clear lungs.  Will repeat chest x-ray on return visit.  Activity as tolerated.  Plan  Patient Instructions  Mucinex DM liquid Twice daily As needed   Symbicort 80 2 puffs Twice daily  , rinse after use. -add spacer  Albuterol Inhaler 2 puffs every 4hr as needed Flutter valve  Twice daily  As needed   Flu shot and RSV vaccine this fall as discussed.  Follow up with Dr. Marchelle Gearing or Simon Llamas NP in 1 year with chest xray and PFT  and As needed   Please contact office for sooner follow up if symptoms do not improve or worsen or seek emergency care

## 2022-12-15 NOTE — Patient Instructions (Signed)
Mucinex DM liquid Twice daily As needed   Symbicort 80 2 puffs Twice daily  , rinse after use. -add spacer  Albuterol Inhaler 2 puffs every 4hr as needed Flutter valve  Twice daily  As needed   Flu shot and RSV vaccine this fall as discussed.  Follow up with Dr. Marchelle Gearing or Nasire Reali NP in 1 year with chest xray and PFT  and As needed   Please contact office for sooner follow up if symptoms do not improve or worsen or seek emergency care

## 2022-12-15 NOTE — Assessment & Plan Note (Signed)
Mild persistent asthma appears under good control.  Asthma action plan discussed.  Will add in spacer to her generic Symbicort inhaler Oral care discussed Plan  Patient Instructions  Mucinex DM liquid Twice daily As needed   Symbicort 80 2 puffs Twice daily  , rinse after use. -add spacer  Albuterol Inhaler 2 puffs every 4hr as needed Flutter valve  Twice daily  As needed   Flu shot and RSV vaccine this fall as discussed.  Follow up with Dr. Marchelle Gearing or Insiya Oshea NP in 1 year with chest xray and PFT  and As needed   Please contact office for sooner follow up if symptoms do not improve or worsen or seek emergency care

## 2022-12-21 ENCOUNTER — Encounter
Payer: Medicare Other | Attending: Physical Medicine and Rehabilitation | Admitting: Physical Medicine and Rehabilitation

## 2022-12-21 VITALS — BP 131/74 | HR 80 | Ht 58.5 in | Wt 100.0 lb

## 2022-12-21 DIAGNOSIS — M7918 Myalgia, other site: Secondary | ICD-10-CM | POA: Diagnosis present

## 2022-12-21 NOTE — Patient Instructions (Addendum)
Voltaren up to 4x day  Blue emu oil  Olive oil, extra virgin   Steroid creams  Avoiding gluten  Food sensitivity Food Allergens 22 allergens

## 2022-12-21 NOTE — Progress Notes (Signed)

## 2023-01-29 ENCOUNTER — Other Ambulatory Visit: Payer: Self-pay | Admitting: Physical Medicine and Rehabilitation

## 2023-01-29 DIAGNOSIS — G4709 Other insomnia: Secondary | ICD-10-CM

## 2023-01-31 ENCOUNTER — Telehealth: Payer: Self-pay | Admitting: Specialist

## 2023-01-31 ENCOUNTER — Other Ambulatory Visit: Payer: Self-pay | Admitting: Physical Medicine and Rehabilitation

## 2023-01-31 MED ORDER — ESZOPICLONE 3 MG PO TABS: 3.0000 mg | ORAL_TABLET | Freq: Every day | ORAL | 3 refills | Status: DC

## 2023-01-31 NOTE — Telephone Encounter (Addendum)
Patient of Dr. Carlis Abbott, needs a refill called in to [facility] on [address] For her eszopiclone 3 mg tabs. Shirlean Mylar, MHA, OT/L [phone] 10 am patient called again and requested a call  back as someone from this office had called her.

## 2023-02-28 ENCOUNTER — Encounter: Payer: Self-pay | Admitting: Physical Medicine and Rehabilitation

## 2023-02-28 ENCOUNTER — Other Ambulatory Visit: Payer: Self-pay | Admitting: Physical Medicine and Rehabilitation

## 2023-02-28 MED ORDER — ESZOPICLONE 3 MG PO TABS: 3.0000 mg | ORAL_TABLET | Freq: Every day | ORAL | 3 refills | Status: DC

## 2023-03-13 ENCOUNTER — Other Ambulatory Visit: Payer: Self-pay | Admitting: Physical Medicine and Rehabilitation

## 2023-03-26 ENCOUNTER — Other Ambulatory Visit: Payer: Self-pay

## 2023-03-26 DIAGNOSIS — M545 Low back pain, unspecified: Secondary | ICD-10-CM

## 2023-03-26 MED ORDER — TRAMADOL HCL 50 MG PO TABS: ORAL_TABLET | ORAL | 3 refills | Status: DC

## 2023-05-23 ENCOUNTER — Other Ambulatory Visit: Payer: Self-pay | Admitting: Adult Health

## 2023-05-24 ENCOUNTER — Telehealth: Payer: Self-pay | Admitting: Adult Health

## 2023-05-24 MED ORDER — BUDESONIDE-FORMOTEROL FUMARATE 80-4.5 MCG/ACT IN AERO: 2.0000 | INHALATION_SPRAY | Freq: Two times a day (BID) | RESPIRATORY_TRACT | 5 refills | Status: DC

## 2023-05-24 NOTE — Telephone Encounter (Signed)
Called [facility] and spoke with [employer], Pharmacists.  Symbicort prescription received was name brand and had instructions, take as directed.  Victoria Watson requested generic Symbicort prescriptions with instructions.  Name brand Symbicort cost is $99.  Patient had informed pharmacy that she can not afford cost. New Symbicort generic prescription sent to pharmacy.  Nothing further at this time.

## 2023-06-21 ENCOUNTER — Encounter: Payer: Self-pay | Admitting: Physical Medicine and Rehabilitation

## 2023-06-21 ENCOUNTER — Encounter
Payer: Medicare Other | Attending: Physical Medicine and Rehabilitation | Admitting: Physical Medicine and Rehabilitation

## 2023-06-21 VITALS — BP 126/72 | HR 86 | Ht 58.5 in | Wt 104.0 lb

## 2023-06-21 DIAGNOSIS — M7918 Myalgia, other site: Secondary | ICD-10-CM | POA: Diagnosis present

## 2023-06-21 MED ORDER — LIDOCAINE HCL 1 % IJ SOLN
5.0000 mL | Freq: Once | INTRAMUSCULAR | Status: AC
Start: 2023-06-21 — End: 2023-06-21
  Administered 2023-06-21: 5 mL via INTRADERMAL

## 2023-06-21 NOTE — Progress Notes (Signed)

## 2023-07-10 ENCOUNTER — Other Ambulatory Visit: Payer: Self-pay | Admitting: Physical Medicine and Rehabilitation

## 2023-07-30 ENCOUNTER — Other Ambulatory Visit: Payer: Self-pay

## 2023-07-30 DIAGNOSIS — M545 Low back pain, unspecified: Secondary | ICD-10-CM

## 2023-07-30 MED ORDER — TRAMADOL HCL 50 MG PO TABS: ORAL_TABLET | ORAL | 3 refills | Status: DC

## 2023-11-05 ENCOUNTER — Other Ambulatory Visit: Payer: Self-pay | Admitting: Physical Medicine and Rehabilitation

## 2023-11-21 ENCOUNTER — Other Ambulatory Visit: Payer: Self-pay

## 2023-11-22 MED ORDER — ESZOPICLONE 3 MG PO TABS
3.0000 mg | ORAL_TABLET | Freq: Every day | ORAL | 3 refills | Status: AC
Start: 2023-11-22 — End: 2024-11-21

## 2023-11-26 ENCOUNTER — Other Ambulatory Visit: Payer: Self-pay | Admitting: Physical Medicine and Rehabilitation

## 2023-11-26 DIAGNOSIS — M545 Low back pain, unspecified: Secondary | ICD-10-CM

## 2023-12-18 ENCOUNTER — Encounter
Payer: Medicare Other | Attending: Physical Medicine and Rehabilitation | Admitting: Physical Medicine and Rehabilitation

## 2023-12-18 ENCOUNTER — Encounter: Payer: Self-pay | Admitting: Physical Medicine and Rehabilitation

## 2023-12-18 VITALS — BP 135/82 | HR 93 | Ht <= 58 in | Wt 103.0 lb

## 2023-12-18 DIAGNOSIS — M7918 Myalgia, other site: Secondary | ICD-10-CM | POA: Insufficient documentation

## 2023-12-18 MED ORDER — LIDOCAINE HCL 1 % IJ SOLN
5.0000 mL | Freq: Once | INTRAMUSCULAR | Status: AC
  Administered 2023-12-18: 5 mL via INTRADERMAL

## 2023-12-18 NOTE — Progress Notes (Signed)

## 2023-12-27 ENCOUNTER — Ambulatory Visit: Admitting: Adult Health

## 2023-12-27 ENCOUNTER — Encounter: Payer: Self-pay | Admitting: Adult Health

## 2023-12-27 VITALS — BP 130/70 | HR 94 | Temp 98.0°F | Ht <= 58 in | Wt 105.0 lb

## 2023-12-27 DIAGNOSIS — J309 Allergic rhinitis, unspecified: Secondary | ICD-10-CM | POA: Diagnosis not present

## 2023-12-27 DIAGNOSIS — J471 Bronchiectasis with (acute) exacerbation: Secondary | ICD-10-CM

## 2023-12-27 DIAGNOSIS — J479 Bronchiectasis, uncomplicated: Secondary | ICD-10-CM

## 2023-12-27 DIAGNOSIS — J4531 Mild persistent asthma with (acute) exacerbation: Secondary | ICD-10-CM | POA: Diagnosis not present

## 2023-12-27 DIAGNOSIS — J310 Chronic rhinitis: Secondary | ICD-10-CM

## 2023-12-27 MED ORDER — ALBUTEROL SULFATE HFA 108 (90 BASE) MCG/ACT IN AERS: 1.0000 | INHALATION_SPRAY | Freq: Four times a day (QID) | RESPIRATORY_TRACT | 2 refills | Status: AC | PRN

## 2023-12-27 NOTE — Progress Notes (Signed)
 @Patient  ID: Victoria Watson, female    DOB: 25-Oct-1950, 73 y.o.   MRN: [id number]  Chief Complaint  Patient presents with   Asthma    Referring provider: Vernon Velna SAUNDERS, MD  HPI: 73 yo female never smoker followed for mild persistent asthma and bronchiectasis    TEST/EVENTS :  HRCT chest that was neg for ILD. Mild Bronchiectasis/RML scarring. And extensive air trapping throughout lungs indicating small airways dz., 2 incidental LLL nodules 4 mm .    Methacholine  challenge test was normal.   Patient had pulmonary function testing 07/2019 that showed normal lung function with no airflow restriction or obstruction.  FEV1 90%, ratio 82, FVC 83%, no significant bronchodilator response.,  Positive mid flow bronchodilator response.  DLCO is normal  12/27/2023 Follow up ; Asthma and Bronchiectasis  Discussed the use of AI scribe software for clinical note transcription with the patient, who gave verbal consent to proceed.  History of Present Illness Victoria Watson is a 73 year old female with mild persistent asthma and bronchiectasis who presents for a one year follow-up.  Over the past year, she has managed her asthma and bronchiectasis well, maintaining an active lifestyle and adhering to her medication regimen. Recently, she developed a congested cough with green sputum and chest tightness couple of days ago, clear mucus yesterday and today. Feels okay overall. No fever or hemoptysis   Her current medication regimen includes Breyna  (generic Symbicort ) with a spacer, and she ensures proper mouth rinsing after use. She has not used albuterol  recently. She uses a flutter valve to aid mucus clearance when needed. . She received the RSV vaccine last year and regularly gets the flu shot. She has opted not to continue with COVID vaccinations but practices good hand hygiene. Her last chest x-ray was clear 2023 , and previous spirometry tests have shown normal results. She uses Flonase  for allergies but has not used Claritin.  Socially, she recently purchased a house at [facility] and enjoys spending time there with her family, including her three grandchildren.     No Known Allergies  Immunization History  Administered Date(s) Administered   INFLUENZA, HIGH DOSE SEASONAL PF 02/10/2021   Influenza,inj,Quad PF,6+ Mos 12/22/2014   Influenza-Unspecified 02/15/2019   Janssen (J&J) SARS-COV-2 Vaccination 08/02/2019   Tdap 09/03/2009   Zoster Recombinant(Shingrix) 05/07/2018, 09/27/2018   Zoster, Live 07/17/2018    Past Medical History:  Diagnosis Date   Asthma    Fibromyalgia    Insomnia    OA (osteoarthritis)    Positive PPD    Took INH tabs x 3 months    Tobacco History: Social History   Tobacco Use  Smoking Status Never   Passive exposure: Past  Smokeless Tobacco Never   Counseling given: Not Answered   Outpatient Medications Prior to Visit  Medication Sig Dispense Refill   Biotin 5 MG TABS Take by mouth daily.     budesonide -formoterol  (BREYNA ) 80-4.5 MCG/ACT inhaler Inhale 2 puffs into the lungs 2 (two) times daily.     Cholecalciferol (VITAMIN D  PO) Take 1 tablet by mouth daily.     cycloSPORINE (RESTASIS) 0.05 % ophthalmic emulsion Place 1 drop into both eyes 2 (two) times daily.     diclofenac  sodium (VOLTAREN ) 1 % GEL Apply 3 grams to 3 large joints up to 3 times daily 3 Tube 3   estradiol (ESTRACE) 1 MG tablet Take 1 mg by mouth daily. (Patient taking differently: Take 0.5 mg by mouth daily.)  eszopiclone  3 MG TABS Take 1 tablet (3 mg total) by mouth at bedtime. Take immediately before bedtime 90 tablet 3   Fluticasone Propionate (FLONASE ALLERGY  RELIEF NA) Place into the nose.     Multiple Vitamins-Minerals (MULTIVITAMIN WITH MINERALS) tablet Take 1 tablet by mouth daily.     pregabalin  (LYRICA ) 25 MG capsule TAKE 1 CAPSULE BY MOUTH TWICE DAILY 60 capsule 3   Respiratory Therapy Supplies (FLUTTER) DEVI Use as directed 1 each 0    traMADol  (ULTRAM ) 50 MG tablet TAKE 1 TABLET(50 MG) BY MOUTH TWICE DAILY 60 tablet 3   vitamin C (ASCORBIC ACID) 500 MG tablet Take 500 mg by mouth daily.     albuterol  (VENTOLIN  HFA) 108 (90 Base) MCG/ACT inhaler Inhale 2 puffs into the lungs every 6 (six) hours as needed for wheezing or shortness of breath. Insurance preference 18 g 2   Calcium Carbonate-Vitamin D  (CALCIUM-VITAMIN D ) 500-200 MG-UNIT tablet Take 1 tablet by mouth. 2 TO 3 TIMES WEEKLY (Patient not taking: Reported on 12/27/2023)     colchicine 0.6 MG tablet colchicine Take 2 Tablet (oral) 1 time per day for 1 days For 1st Dose; repeat with 1 po in 1 hour for second dose. [id number] tablet 1 time per day oral 1 days active 0.6 mg (Patient not taking: Reported on 12/27/2023)     SYMBICORT  80-4.5 MCG/ACT inhaler INHALE 2 PUFFS INTO THE LUNGS TWICE DAILY (Patient not taking: Reported on 12/27/2023) 10.2 g 3   budesonide -formoterol  (SYMBICORT ) 80-4.5 MCG/ACT inhaler Inhale 2 puffs into the lungs 2 (two) times daily. (Patient not taking: Reported on 12/27/2023) 10.2 g 5   Facility-Administered Medications Prior to Visit  Medication Dose Route Frequency Provider Last Rate Last Admin   lidocaine  (XYLOCAINE ) 1 % (with pres) injection 3 mL  3 mL Other Once Raulkar, Sven SQUIBB, MD       sodium chloride  (PF) 0.9 % injection 2 mL  2 mL Other Once Raulkar, Sven SQUIBB, MD         Review of Systems:   Constitutional:   No  weight loss, night sweats,  Fevers, chills, fatigue, or  lassitude.  HEENT:   No headaches,  Difficulty swallowing,  Tooth/dental problems, or  Sore throat,                No sneezing, itching, ear ache, nasal congestion, post nasal drip,   CV:  No chest pain,  Orthopnea, PND, swelling in lower extremities, anasarca, dizziness, palpitations, syncope.   GI  No heartburn, indigestion, abdominal pain, nausea, vomiting, diarrhea, change in bowel habits, loss of appetite, bloody stools.   Resp:    No chest wall deformity  Skin: no rash  or lesions.  GU: no dysuria, change in color of urine, no urgency or frequency.  No flank pain, no hematuria   MS:  No joint pain or swelling.  No decreased range of motion.  No back pain.    Physical Exam  BP 130/70 (BP Location: Left Arm, Patient Position: Sitting, Cuff Size: Normal)   Pulse 94   Temp 98 F (36.7 C) (Oral)   Ht 4' 10 (1.473 m)   Wt 105 lb (47.6 kg)   SpO2 97% Comment: RA  BMI 21.95 kg/m   GEN: A/Ox3; pleasant , NAD, well nourished , thin    HEENT:  [place]/AT,    NOSE-clear, THROAT-clear, no lesions, no postnasal drip or exudate noted.   NECK:  Supple w/ fair ROM; no JVD; normal carotid impulses w/o  bruits; no thyromegaly or nodules palpated; no lymphadenopathy.    RESP  Clear  P & A; w/o, wheezes/ rales/ or rhonchi. no accessory muscle use, no dullness to percussion  CARD:  RRR, no m/r/g, no peripheral edema, pulses intact, no cyanosis or clubbing.  GI:   Soft & nt; nml bowel sounds; no organomegaly or masses detected.   Musco: Warm bil, no deformities or joint swelling noted.   Neuro: alert, no focal deficits noted.    Skin: Warm, no lesions or rashes    Lab Results:  CBC     BNP No results found for: BNP  ProBNP No results found for: PROBNP  Imaging: No results found.  lidocaine  (XYLOCAINE ) 1 % (with pres) injection 5 mL     Date Action Dose Route User   12/18/2023 1443 Given 5 mL Intradermal Kennyth Sluder, RMA          Latest Ref Rng & Units 08/12/2019   10:03 AM 06/29/2015   12:51 PM 06/28/2015   12:45 PM  PFT Results  FVC-Pre L 2.05  2.33  2.24   FVC-Predicted Pre % 82  90  86   FVC-Post L 2.08  2.17  2.27   FVC-Predicted Post % 83  83  87   Pre FEV1/FVC % % 80  78  82   Post FEV1/FCV % % 82  83  80   FEV1-Pre L 1.63  1.82  1.83   FEV1-Predicted Pre % 86  91  92   FEV1-Post L 1.72  1.80  1.83   DLCO uncorrected ml/min/mmHg 18.70  18.28    DLCO UNC% % 115  108    DLCO corrected ml/min/mmHg 18.70     DLCO COR  %Predicted % 115     DLVA Predicted % 132  132    TLC L 4.20  4.07    TLC % Predicted % 99  96    RV % Predicted % 112  98      Lab Results  Component Value Date   NITRICOXIDE 15 06/16/2015        Assessment & Plan:   No problem-specific Assessment & Plan notes found for this encounter.  Assessment and Plan Assessment & Plan Mild persistent asthma -mild increased sx for last couple days  with acute cough and sputum production   She presents with mild persistent asthma and bronchiectasis, experiencing an acute cough and green sputum, likely from a mild viral infection /allergic or bronchiectasis flare. Symptoms appear very mild.   Examination is unrevealing.  Continue Breyna  with a spacer, rinsing her mouth after use. Use albuterol  as needed, followed by the flutter valve to aid mucociliary clearance. Increase flutter valve use to 2-3 times daily during increased cough. Use liquid Mucinex to loosen mucus. Consider Claritin if nasal stuffiness or drainage increases, alongside Flonase or saline spray. Administer flu shot next month. Contact provider if symptoms worsen or persist, as antibiotics may be needed if sputum remains green. Perform spirometry at the next visit to assess lung function.  Allergic rhinitis -Flonase As needed   Add Claritin daily As needed    Bronchiectasis - Mild  ?mild flare. Symptoms remain minimal. Call back if cough/congestion persist.  Add flutter valve 2-3 times a day  Albuterol  inhaler As needed  followed by flutter  Mucinex DM As needed    Plan  Patient Instructions  Mucinex DM liquid Twice daily As needed   Symbicort  80 2 puffs Twice daily  , rinse  after use.  spacer  Albuterol  Inhaler 2 puffs every 4hr as needed Flutter valve  Twice daily  As needed   Flu shot this fall as discussed.  Claritin 10mg  At bedtime  As needed   Flonase nasal As needed   Follow up with Dr. Geronimo or Britlyn Martine NP in 1 year Please contact office for sooner follow up  if symptoms do not improve or worsen or seek emergency care        Madelin Stank, NP 12/27/2023

## 2023-12-27 NOTE — Patient Instructions (Addendum)
 Mucinex DM liquid Twice daily As needed   Symbicort  80 2 puffs Twice daily  , rinse after use.  spacer  Albuterol  Inhaler 2 puffs every 4hr as needed Flutter valve  Twice daily  As needed   Flu shot this fall as discussed.  Claritin 10mg  At bedtime  As needed   Flonase nasal As needed   Follow up with Dr. Geronimo or Victoria Arneson NP in 1 year Please contact office for sooner follow up if symptoms do not improve or worsen or seek emergency care

## 2024-01-29 NOTE — Therapy (Unsigned)
 OUTPATIENT PHYSICAL THERAPY CERVICAL EVALUATION   Patient Name: Victoria Watson MRN: [id number] DOB:[birth year], 73 y.o., female Today's Date: 01/30/2024  END OF SESSION:  PT End of Session - 01/30/24 1704     Visit Number 1    Number of Visits 6    Date for Recertification  03/31/24    Authorization Type BCBS    PT Start Time 1455    PT Stop Time 1530    PT Time Calculation (min) 35 min    Activity Tolerance Patient tolerated treatment well    Behavior During Therapy WFL for tasks assessed/performed          Past Medical History:  Diagnosis Date   Asthma    Fibromyalgia    Insomnia    OA (osteoarthritis)    Positive PPD    Took INH tabs x 3 months   Past Surgical History:  Procedure Laterality Date   TONSILLECTOMY     tooth implant Left    TOTAL ABDOMINAL HYSTERECTOMY  1990   Patient Active Problem List   Diagnosis Date Noted   Asthma 08/13/2019   Chronic rhinitis 10/28/2018   Hypermobility of joint 11/01/2016   DDD (degenerative disc disease), thoracic 11/01/2016   Osteopenia of multiple sites 11/01/2016   Positive PPD treated with INH  11/01/2016   Rheumatoid factor positive -CCP  11/01/2016   Myofascial muscle pain 03/28/2016   Insomnia 03/28/2016   Other fatigue 03/28/2016   DDD (degenerative disc disease), lumbar 03/28/2016   DDD (degenerative disc disease), cervical 03/28/2016   Primary osteoarthritis of both hands 03/28/2016   Bronchiectasis (HCC) 07/09/2015   Wheezing 06/16/2015   Chest crackles 06/16/2015    PCP: Vernon Velna SAUNDERS, MD   REFERRING PROVIDER: Lorilee Sven SQUIBB, MD  REFERRING DIAG: M79.18 (ICD-10-CM) - Cervical myofascial pain syndrome  THERAPY DIAG:  Cervicalgia  Myofascial neck pain  Abnormal posture  Rationale for Evaluation and Treatment: Rehabilitation  ONSET DATE: chronic  SUBJECTIVE:                                                                                                                                                                                                          SUBJECTIVE STATEMENT: Describes chronic neck pain and experiences flare ups over time.  Recent trigger point injections performed.  Has had PT in the past but most relief noted with moist heat, stretching and topical creams.  No radicular symptoms identified Hand dominance: Right  PERTINENT HISTORY:  Cervical myofascial pain not relieved by medication management and other conservative care.  Informed consent was obtained after describing risk and benefits of the procedure with the patient, this includes bleeding, bruising, infection and medication side effects.  The patient wishes to proceed and has given written consent.  The patient was placed in a seated position.  The area of pain was marked and prepped with Betadine.  It was entered with a 25-gauge 1/2 inch needle and a total of 5 mL of 1% lidocaine  and normal saline was injected into a total of 4 trigger points, after negative draw back for blood.  The patient tolerated the procedure well.  Post procedure instructions were given.   PAIN:  Are you having pain? Yes: NPRS scale: 9/10 Pain location: neck and R shoulder region Pain description: ache, sore, tight Aggravating factors: driving, prolonged  Relieving factors: heat, OTC creams, stretching  PRECAUTIONS: None  RED FLAGS: None     WEIGHT BEARING RESTRICTIONS: No  FALLS:  Has patient fallen in last 6 months? No  OCCUPATION: retired  PLOF: Independent  PATIENT GOALS: To manage my pain  NEXT MD VISIT: TBD  OBJECTIVE:  Note: Objective measures were completed at Evaluation unless otherwise noted.  DIAGNOSTIC FINDINGS:  none  PATIENT SURVEYS:  NDI: 14/50  Minimum Detectable Change (90% confidence): 5 points or 10% points   POSTURE: rounded shoulders and forward head  PALPATION: Myofascial restriction detected.   CERVICAL ROM:   Active ROM A/PROM (deg) eval  Flexion 75%  Extension 75%   Right lateral flexion 50%  Left lateral flexion 50%  Right rotation 90%  Left rotation 75%   (Blank rows = not tested)  UPPER EXTREMITY ROM:  Active ROM Right eval Left eval  Shoulder flexion    Shoulder extension    Shoulder abduction    Shoulder adduction    Shoulder extension    Shoulder internal rotation    Shoulder external rotation    Elbow flexion    Elbow extension    Wrist flexion    Wrist extension    Wrist ulnar deviation    Wrist radial deviation    Wrist pronation    Wrist supination     (Blank rows = not tested)  UPPER EXTREMITY MMT:  MMT Right eval Left eval  Shoulder flexion    Shoulder extension    Shoulder abduction    Shoulder adduction    Shoulder extension    Shoulder internal rotation    Shoulder external rotation    Middle trapezius    Lower trapezius    Elbow flexion    Elbow extension    Wrist flexion    Wrist extension    Wrist ulnar deviation    Wrist radial deviation    Wrist pronation    Wrist supination    Grip strength     (Blank rows = not tested)  CERVICAL SPECIAL TESTS:  Neck flexor muscle endurance test: Negative  FUNCTIONAL TESTS:  deferred  TREATMENT:  [facility] Adult PT Treatment:                                                DATE: 01/30/24 Eval and HEP Self Care:  Additional minutes spent for educating on updated Therapeutic Home Exercise Program as well as comparing current status to condition at start of symptoms. This included exercises focusing on stretching, strengthening, with focus on eccentric aspects. Long term goals include an improvement in range of motion, strength, endurance as well as avoiding reinjury. Patient's frequency would include in 1-2 times a day, 3-5 times a week for a duration of 6-12 weeks. Proper technique shown and discussed handout in great detail. All questions were  discussed and addressed.       PATIENT EDUCATION:  Education details: Discussed eval findings, rehab rationale and POC and patient is in agreement  Person educated: Patient Education method: Explanation and Handouts Education comprehension: verbalized understanding and needs further education  HOME EXERCISE PROGRAM: Access Code: [email or web address] URL: [email or web address] Date: 01/30/2024 Prepared by: Reyes Kohut  Exercises - Sternocleidomastoid Stretch  - 2-3 x daily - 5 x weekly - 1 sets - 2 reps - 30s hold  ASSESSMENT:  CLINICAL IMPRESSION: Patient is a 73 y.o. female who was seen today for physical therapy evaluation and treatment for chronic myofascial cervical pain w/o radicular symptoms.  BUE ROM and strength WNL.  Patient presents with forward head and rounded shoulder posturing with myofascial restriction detected.   Patient would benefit fro OPPT to establish home based stretching and postural correction program in conjunction with periodic massage therapy/MFR sessions.  OBJECTIVE IMPAIRMENTS: decreased activity tolerance, decreased knowledge of condition, decreased ROM, increased fascial restrictions, impaired perceived functional ability, impaired UE functional use, postural dysfunction, and pain.   ACTIVITY LIMITATIONS: carrying, lifting, sitting, sleeping, bed mobility, and driving  PERSONAL FACTORS: Age, Fitness, and Past/current experiences are also affecting patient's functional outcome.   REHAB POTENTIAL: Fair due to chronicity and nature of symptoms  CLINICAL DECISION MAKING: Stable/uncomplicated  EVALUATION COMPLEXITY: Low   GOALS: Goals reviewed with patient? No   SHORT TERM GOALS=LONG TERM GOALS: Target date: ***  Patient will score at least ***% on FOTO to signify clinically meaningful improvement in functional abilities.   Baseline:  Goal status: INITIAL  2.  Patient will acknowledge ***/10 pain at least once during episode of care    Baseline:  Goal status: INITIAL  3.  *** Baseline:  Goal status: INITIAL  4.  *** Baseline:  Goal status: INITIAL  5.  *** Baseline:  Goal status: INITIAL  6.  *** Baseline:  Goal status: INITIAL   PLAN:  PT FREQUENCY: 1x/week  PT DURATION: 1 week  PLANNED INTERVENTIONS: 97110-Therapeutic exercises, 97530- Therapeutic activity, 97112- Neuromuscular re-education, 97535- Self Care, [id number]- Manual therapy, and Patient/Family education  PLAN FOR NEXT SESSION: HEP review and update, manual techniques as appropriate, aerobic tasks, ROM and flexibility activities, strengthening and PREs, TPDN, gait and balance training as needed     Reyes CHRISTELLA Kohut, PT 01/30/2024, 5:05 PM

## 2024-01-30 ENCOUNTER — Ambulatory Visit: Attending: Physical Medicine and Rehabilitation

## 2024-01-30 ENCOUNTER — Other Ambulatory Visit: Payer: Self-pay

## 2024-01-30 DIAGNOSIS — M7918 Myalgia, other site: Secondary | ICD-10-CM | POA: Diagnosis not present

## 2024-01-30 DIAGNOSIS — M542 Cervicalgia: Secondary | ICD-10-CM | POA: Insufficient documentation

## 2024-01-30 DIAGNOSIS — R293 Abnormal posture: Secondary | ICD-10-CM | POA: Insufficient documentation

## 2024-02-08 NOTE — Therapy (Unsigned)
 OUTPATIENT PHYSICAL THERAPY TREATMENT NOTE   Patient Name: Victoria Watson MRN: [id number] DOB:May 05, 1950, 73 y.o., female Today's Date: 02/12/2024  END OF SESSION:  PT End of Session - 02/12/24 1534     Visit Number 2    Number of Visits 6    Date for Recertification  03/31/24    Authorization Type BCBS    PT Start Time 1530    PT Stop Time 1610    PT Time Calculation (min) 40 min    Activity Tolerance Patient tolerated treatment well    Behavior During Therapy WFL for tasks assessed/performed           Past Medical History:  Diagnosis Date   Asthma    Fibromyalgia    Insomnia    OA (osteoarthritis)    Positive PPD    Took INH tabs x 3 months   Past Surgical History:  Procedure Laterality Date   TONSILLECTOMY     tooth implant Left    TOTAL ABDOMINAL HYSTERECTOMY  1990   Patient Active Problem List   Diagnosis Date Noted   Asthma 08/13/2019   Chronic rhinitis 10/28/2018   Hypermobility of joint 11/01/2016   DDD (degenerative disc disease), thoracic 11/01/2016   Osteopenia of multiple sites 11/01/2016   Positive PPD treated with INH  11/01/2016   Rheumatoid factor positive -CCP  11/01/2016   Myofascial muscle pain 03/28/2016   Insomnia 03/28/2016   Other fatigue 03/28/2016   DDD (degenerative disc disease), lumbar 03/28/2016   DDD (degenerative disc disease), cervical 03/28/2016   Primary osteoarthritis of both hands 03/28/2016   Bronchiectasis (HCC) 07/09/2015   Wheezing 06/16/2015   Chest crackles 06/16/2015    PCP: Vernon Velna SAUNDERS, MD   REFERRING PROVIDER: Lorilee Sven SQUIBB, MD  REFERRING DIAG: M79.18 (ICD-10-CM) - Cervical myofascial pain syndrome  THERAPY DIAG:  Cervicalgia  Myofascial neck pain  Abnormal posture  Rationale for Evaluation and Treatment: Rehabilitation  ONSET DATE: chronic  SUBJECTIVE:                                                                                                                                                                                                          SUBJECTIVE STATEMENT: Describes chronic neck pain and experiences flare ups over time.  Recent trigger point injections performed.  Has had PT in the past but most relief noted with moist heat, stretching and topical creams.  No radicular symptoms identified Hand dominance: Right  PERTINENT HISTORY:  Cervical myofascial pain not relieved by medication management and other conservative care.  Informed consent was obtained after describing risk and benefits of the procedure with the patient, this includes bleeding, bruising, infection and medication side effects.  The patient wishes to proceed and has given written consent.  The patient was placed in a seated position.  The area of pain was marked and prepped with Betadine.  It was entered with a 25-gauge 1/2 inch needle and a total of 5 mL of 1% lidocaine  and normal saline was injected into a total of 4 trigger points, after negative draw back for blood.  The patient tolerated the procedure well.  Post procedure instructions were given.   PAIN:  Are you having pain? Yes: NPRS scale: 9/10 Pain location: neck and R shoulder region Pain description: ache, sore, tight Aggravating factors: driving, prolonged  Relieving factors: heat, OTC creams, stretching  PRECAUTIONS: None  RED FLAGS: None     WEIGHT BEARING RESTRICTIONS: No  FALLS:  Has patient fallen in last 6 months? No  OCCUPATION: retired  PLOF: Independent  PATIENT GOALS: To manage my pain  NEXT MD VISIT: TBD  OBJECTIVE:  Note: Objective measures were completed at Evaluation unless otherwise noted.  DIAGNOSTIC FINDINGS:  none  PATIENT SURVEYS:  NDI: 14/50  Minimum Detectable Change (90% confidence): 5 points or 10% points   POSTURE: rounded shoulders and forward head  PALPATION: Myofascial restriction detected.   CERVICAL ROM:   Active ROM A/PROM (deg) eval  Flexion 75%  Extension 75%   Right lateral flexion 50%  Left lateral flexion 50%  Right rotation 90%  Left rotation 75%   (Blank rows = not tested)  UPPER EXTREMITY ROM: WFL  Active ROM Right eval Left eval  Shoulder flexion    Shoulder extension    Shoulder abduction    Shoulder adduction    Shoulder extension    Shoulder internal rotation    Shoulder external rotation    Elbow flexion    Elbow extension    Wrist flexion    Wrist extension    Wrist ulnar deviation    Wrist radial deviation    Wrist pronation    Wrist supination     (Blank rows = not tested)  UPPER EXTREMITY MMT: [facility]  MMT Right eval Left eval  Shoulder flexion    Shoulder extension    Shoulder abduction    Shoulder adduction    Shoulder extension    Shoulder internal rotation    Shoulder external rotation    Middle trapezius    Lower trapezius    Elbow flexion    Elbow extension    Wrist flexion    Wrist extension    Wrist ulnar deviation    Wrist radial deviation    Wrist pronation    Wrist supination    Grip strength     (Blank rows = not tested)  CERVICAL SPECIAL TESTS:  Neck flexor muscle endurance test: Negative  FUNCTIONAL TESTS:  deferred  TREATMENT:         [facility] Adult PT Treatment:                                                DATE: 02/12/24 Therapeutic Exercise: UBE L1 3/3 min SCM stretch 30s x2 B Manual Therapy: [place] stretch 30s x3 B Levator stretch 30s x2 B UT stretch 30s x2 B Neuromuscular re-ed: Seated hor abd YTB 15x Seated  ER YTB 15x Supine hor abd YTB 15x B, 15/15                                                                                                                  [facility] Adult PT Treatment:                                                DATE: 01/30/24 Eval and HEP Self Care:  Additional minutes spent for educating on updated Therapeutic Home Exercise Program as well as comparing current status to condition at start of symptoms. This included exercises focusing on  stretching, strengthening, with focus on eccentric aspects. Long term goals include an improvement in range of motion, strength, endurance as well as avoiding reinjury. Patient's frequency would include in 1-2 times a day, 3-5 times a week for a duration of 6-12 weeks. Proper technique shown and discussed handout in great detail. All questions were discussed and addressed.       PATIENT EDUCATION:  Education details: Discussed eval findings, rehab rationale and POC and patient is in agreement  Person educated: Patient Education method: Explanation and Handouts Education comprehension: verbalized understanding and needs further education  HOME EXERCISE PROGRAM: Access Code: [email or web address] URL: [email or web address] Date: 02/12/2024 Prepared by: Reyes Kohut  Exercises - Sternocleidomastoid Stretch  - 2-3 x daily - 5 x weekly - 1 sets - 2 reps - 30s hold - Shoulder External Rotation and Scapular Retraction with Resistance  - 1 x daily - 5 x weekly - 1-2 sets - 10 reps - Supine Shoulder Horizontal Abduction with Resistance  - 1 x daily - 5 x weekly - 1-2 sets - 15 reps  ASSESSMENT:  CLINICAL IMPRESSION: First f/u session consisted of aerobic w/u followed by postural retraining exercises for strength and flexibility.  Session concluded with manual stretch of cervical and periscapular muscles.  Patient is a 73 y.o. female who was seen today for physical therapy evaluation and treatment for chronic myofascial cervical pain w/o radicular symptoms.  BUE ROM and strength WNL.  Patient presents with forward head and rounded shoulder posturing with myofascial restriction detected.   Patient would benefit fro OPPT to establish home based stretching and postural correction program in conjunction with periodic massage therapy/MFR sessions.  OBJECTIVE IMPAIRMENTS: decreased activity tolerance, decreased knowledge of condition, decreased ROM, increased fascial restrictions, impaired perceived  functional ability, impaired UE functional use, postural dysfunction, and pain.   ACTIVITY LIMITATIONS: carrying, lifting, sitting, sleeping, bed mobility, and driving  PERSONAL FACTORS: Age, Fitness, and Past/current experiences are also affecting patient's functional outcome.   REHAB POTENTIAL: Fair due to chronicity and nature of symptoms  CLINICAL DECISION MAKING: Stable/uncomplicated  EVALUATION COMPLEXITY: Low   GOALS: Goals reviewed with patient? No   SHORT TERM GOALS=LONG TERM GOALS: Target date: 03/31/24  Patient will score at least 9/50 on NDI to signify clinically meaningful improvement  in functional abilities.   Baseline: 14/50 Goal status: INITIAL  2.  Patient will acknowledge 6/10 pain at least once during episode of care   Baseline: 9/10 Goal status: INITIAL  3.  Patient to demonstrate independence in HEP Baseline: [email or web address] Goal status: INITIAL  4.  Increase cervical mobility to 75% Baseline:  Active ROM A/PROM (deg) eval  Flexion 75%  Extension 75%  Right lateral flexion 50%  Left lateral flexion 50%  Right rotation 90%  Left rotation 75%   Goal status: INITIAL   PLAN:  PT FREQUENCY: 1x/week  PT DURATION: 6 weeks  PLANNED INTERVENTIONS: 97110-Therapeutic exercises, 97530- Therapeutic activity, 97112- Neuromuscular re-education, 97535- Self Care, [id number]- Manual therapy, and Patient/Family education  PLAN FOR NEXT SESSION: HEP review and update, manual techniques as appropriate, aerobic tasks, ROM and flexibility activities, strengthening and PREs, TPDN, gait and balance training as needed     Reyes CHRISTELLA Kohut, PT 02/12/2024, 4:17 PM

## 2024-02-12 ENCOUNTER — Ambulatory Visit

## 2024-02-12 DIAGNOSIS — M542 Cervicalgia: Secondary | ICD-10-CM | POA: Diagnosis not present

## 2024-02-12 DIAGNOSIS — R293 Abnormal posture: Secondary | ICD-10-CM

## 2024-02-21 ENCOUNTER — Ambulatory Visit: Payer: Self-pay

## 2024-02-26 ENCOUNTER — Ambulatory Visit

## 2024-02-27 ENCOUNTER — Other Ambulatory Visit: Payer: Self-pay | Admitting: Physical Medicine and Rehabilitation

## 2024-03-01 ENCOUNTER — Other Ambulatory Visit: Payer: Self-pay | Admitting: Physical Medicine and Rehabilitation

## 2024-03-01 DIAGNOSIS — M545 Low back pain, unspecified: Secondary | ICD-10-CM

## 2024-03-04 NOTE — Therapy (Deleted)
 OUTPATIENT PHYSICAL THERAPY TREATMENT NOTE   Patient Name: Victoria Watson MRN: [id number] DOB:1950/06/27, 73 y.o., female Today's Date: 03/04/2024  END OF SESSION:     Past Medical History:  Diagnosis Date   Asthma    Fibromyalgia    Insomnia    OA (osteoarthritis)    Positive PPD    Took INH tabs x 3 months   Past Surgical History:  Procedure Laterality Date   TONSILLECTOMY     tooth implant Left    TOTAL ABDOMINAL HYSTERECTOMY  1990   Patient Active Problem List   Diagnosis Date Noted   Asthma 08/13/2019   Chronic rhinitis 10/28/2018   Hypermobility of joint 11/01/2016   DDD (degenerative disc disease), thoracic 11/01/2016   Osteopenia of multiple sites 11/01/2016   Positive PPD treated with INH  11/01/2016   Rheumatoid factor positive -CCP  11/01/2016   Myofascial muscle pain 03/28/2016   Insomnia 03/28/2016   Other fatigue 03/28/2016   DDD (degenerative disc disease), lumbar 03/28/2016   DDD (degenerative disc disease), cervical 03/28/2016   Primary osteoarthritis of both hands 03/28/2016   Bronchiectasis (HCC) 07/09/2015   Wheezing 06/16/2015   Chest crackles 06/16/2015    PCP: Vernon Velna SAUNDERS, MD   REFERRING PROVIDER: Lorilee Sven SQUIBB, MD  REFERRING DIAG: M79.18 (ICD-10-CM) - Cervical myofascial pain syndrome  THERAPY DIAG:  No diagnosis found.  Rationale for Evaluation and Treatment: Rehabilitation  ONSET DATE: chronic  SUBJECTIVE:                                                                                                                                                                                                         SUBJECTIVE STATEMENT: Describes chronic neck pain and experiences flare ups over time.  Recent trigger point injections performed.  Has had PT in the past but most relief noted with moist heat, stretching and topical creams.  No radicular symptoms identified Hand dominance: Right  PERTINENT HISTORY:  Cervical  myofascial pain not relieved by medication management and other conservative care.   Informed consent was obtained after describing risk and benefits of the procedure with the patient, this includes bleeding, bruising, infection and medication side effects.  The patient wishes to proceed and has given written consent.  The patient was placed in a seated position.  The area of pain was marked and prepped with Betadine.  It was entered with a 25-gauge 1/2 inch needle and a total of 5 mL of 1% lidocaine  and normal saline was injected into a total of 4 trigger points, after negative  draw back for blood.  The patient tolerated the procedure well.  Post procedure instructions were given.   PAIN:  Are you having pain? Yes: NPRS scale: 9/10 Pain location: neck and R shoulder region Pain description: ache, sore, tight Aggravating factors: driving, prolonged  Relieving factors: heat, OTC creams, stretching  PRECAUTIONS: None  RED FLAGS: None     WEIGHT BEARING RESTRICTIONS: No  FALLS:  Has patient fallen in last 6 months? No  OCCUPATION: retired  PLOF: Independent  PATIENT GOALS: To manage my pain  NEXT MD VISIT: TBD  OBJECTIVE:  Note: Objective measures were completed at Evaluation unless otherwise noted.  DIAGNOSTIC FINDINGS:  none  PATIENT SURVEYS:  NDI: 14/50  Minimum Detectable Change (90% confidence): 5 points or 10% points   POSTURE: rounded shoulders and forward head  PALPATION: Myofascial restriction detected.   CERVICAL ROM:   Active ROM A/PROM (deg) eval  Flexion 75%  Extension 75%  Right lateral flexion 50%  Left lateral flexion 50%  Right rotation 90%  Left rotation 75%   (Blank rows = not tested)  UPPER EXTREMITY ROM: WFL  Active ROM Right eval Left eval  Shoulder flexion    Shoulder extension    Shoulder abduction    Shoulder adduction    Shoulder extension    Shoulder internal rotation    Shoulder external rotation    Elbow flexion     Elbow extension    Wrist flexion    Wrist extension    Wrist ulnar deviation    Wrist radial deviation    Wrist pronation    Wrist supination     (Blank rows = not tested)  UPPER EXTREMITY MMT: [facility]  MMT Right eval Left eval  Shoulder flexion    Shoulder extension    Shoulder abduction    Shoulder adduction    Shoulder extension    Shoulder internal rotation    Shoulder external rotation    Middle trapezius    Lower trapezius    Elbow flexion    Elbow extension    Wrist flexion    Wrist extension    Wrist ulnar deviation    Wrist radial deviation    Wrist pronation    Wrist supination    Grip strength     (Blank rows = not tested)  CERVICAL SPECIAL TESTS:  Neck flexor muscle endurance test: Negative  FUNCTIONAL TESTS:  deferred  TREATMENT:         [facility] Adult PT Treatment:                                                DATE: 02/12/24 Therapeutic Exercise: UBE L1 3/3 min SCM stretch 30s x2 B Manual Therapy: Scalene stretch 30s x3 B Levator stretch 30s x2 B UT stretch 30s x2 B Neuromuscular re-ed: Seated hor abd YTB 15x Seated ER YTB 15x Supine hor abd YTB 15x B, 15/15  [facility] Adult PT Treatment:                                                DATE: 01/30/24 Eval and HEP Self Care:  Additional minutes spent for educating on updated Therapeutic Home Exercise Program as well as comparing current status to condition at start of symptoms. This included exercises focusing on stretching, strengthening, with focus on eccentric aspects. Long term goals include an improvement in range of motion, strength, endurance as well as avoiding reinjury. Patient's frequency would include in 1-2 times a day, 3-5 times a week for a duration of 6-12 weeks. Proper technique shown and discussed handout in great detail. All questions were discussed and addressed.       PATIENT  EDUCATION:  Education details: Discussed eval findings, rehab rationale and POC and patient is in agreement  Person educated: Patient Education method: Explanation and Handouts Education comprehension: verbalized understanding and needs further education  HOME EXERCISE PROGRAM: Access Code: [email or web address] URL: [email or web address] Date: 02/12/2024 Prepared by: Reyes Kohut  Exercises - Sternocleidomastoid Stretch  - 2-3 x daily - 5 x weekly - 1 sets - 2 reps - 30s hold - Shoulder External Rotation and Scapular Retraction with Resistance  - 1 x daily - 5 x weekly - 1-2 sets - 10 reps - Supine Shoulder Horizontal Abduction with Resistance  - 1 x daily - 5 x weekly - 1-2 sets - 15 reps  ASSESSMENT:  CLINICAL IMPRESSION: First f/u session consisted of aerobic w/u followed by postural retraining exercises for strength and flexibility.  Session concluded with manual stretch of cervical and periscapular muscles.  Patient is a 73 y.o. female who was seen today for physical therapy evaluation and treatment for chronic myofascial cervical pain w/o radicular symptoms.  BUE ROM and strength WNL.  Patient presents with forward head and rounded shoulder posturing with myofascial restriction detected.   Patient would benefit fro OPPT to establish home based stretching and postural correction program in conjunction with periodic massage therapy/MFR sessions.  OBJECTIVE IMPAIRMENTS: decreased activity tolerance, decreased knowledge of condition, decreased ROM, increased fascial restrictions, impaired perceived functional ability, impaired UE functional use, postural dysfunction, and pain.   ACTIVITY LIMITATIONS: carrying, lifting, sitting, sleeping, bed mobility, and driving  PERSONAL FACTORS: Age, Fitness, and Past/current experiences are also affecting patient's functional outcome.   REHAB POTENTIAL: Fair due to chronicity and nature of symptoms  CLINICAL DECISION MAKING:  Stable/uncomplicated  EVALUATION COMPLEXITY: Low   GOALS: Goals reviewed with patient? No   SHORT TERM GOALS=LONG TERM GOALS: Target date: 03/31/24  Patient will score at least 9/50 on NDI to signify clinically meaningful improvement in functional abilities.   Baseline: 14/50 Goal status: INITIAL  2.  Patient will acknowledge 6/10 pain at least once during episode of care   Baseline: 9/10 Goal status: INITIAL  3.  Patient to demonstrate independence in HEP Baseline: [email or web address] Goal status: INITIAL  4.  Increase cervical mobility to 75% Baseline:  Active ROM A/PROM (deg) eval  Flexion 75%  Extension 75%  Right lateral flexion 50%  Left lateral flexion 50%  Right rotation 90%  Left rotation 75%   Goal status: INITIAL   PLAN:  PT FREQUENCY: 1x/week  PT DURATION: 6 weeks  PLANNED INTERVENTIONS: 97110-Therapeutic exercises, 97530- Therapeutic activity, [id number]- Neuromuscular re-education, 97535- Self Care, [id number]- Manual therapy, and Patient/Family education  PLAN FOR NEXT SESSION: HEP review and update, manual techniques as appropriate, aerobic tasks, ROM and flexibility activities, strengthening and PREs, TPDN, gait and balance training as needed     Reyes CHRISTELLA Kohut, PT 03/04/2024, 1:37 PM

## 2024-03-05 ENCOUNTER — Ambulatory Visit

## 2024-03-06 ENCOUNTER — Other Ambulatory Visit: Payer: Self-pay

## 2024-03-06 ENCOUNTER — Telehealth: Payer: Self-pay

## 2024-03-06 MED ORDER — BUDESONIDE-FORMOTEROL FUMARATE 80-4.5 MCG/ACT IN AERO: 2.0000 | INHALATION_SPRAY | Freq: Two times a day (BID) | RESPIRATORY_TRACT | 11 refills | Status: DC

## 2024-03-06 MED ORDER — SYMBICORT 80-4.5 MCG/ACT IN AERO: 2.0000 | INHALATION_SPRAY | Freq: Two times a day (BID) | RESPIRATORY_TRACT | 3 refills | Status: AC

## 2024-03-06 NOTE — Telephone Encounter (Signed)
 Copied from CRM #[id number]. Topic: Clinical - Prescription Issue >> Mar 06, 2024  4:16 PM Celestine FALCON wrote: Reason for CRM: Pt stated she needs a new prescription from NP Tammy Parrett to be sent for SYMBICORT  80-4.5 MCG/ACT inhaler due to it being a different formulary. Pt state she needs the generic prescription of SYMBICORT  sent to pharmacy [facility] #[id number] [place] - [address] AT [address]  [place] [address] Phone: [phone] Fax: [phone] Hours: Not open 24 hours  Pt's phone number is [phone] ok to leave a vm.    Spoke with patient  let pharmacy to know ok to substitute generic since insurance won't cover name brand

## 2024-03-10 NOTE — Therapy (Unsigned)
 OUTPATIENT PHYSICAL THERAPY TREATMENT NOTE   Patient Name: Victoria Watson MRN: [id number] DOB:04-16-1951, 73 y.o., female Today's Date: 03/11/2024  END OF SESSION:  PT End of Session - 03/11/24 1406     Visit Number 3    Number of Visits 6    Date for Recertification  03/31/24    Authorization Type BCBS    PT Start Time 1405    PT Stop Time 1445    PT Time Calculation (min) 40 min    Activity Tolerance Patient tolerated treatment well    Behavior During Therapy WFL for tasks assessed/performed            Past Medical History:  Diagnosis Date   Asthma    Fibromyalgia    Insomnia    OA (osteoarthritis)    Positive PPD    Took INH tabs x 3 months   Past Surgical History:  Procedure Laterality Date   TONSILLECTOMY     tooth implant Left    TOTAL ABDOMINAL HYSTERECTOMY  1990   Patient Active Problem List   Diagnosis Date Noted   Asthma 08/13/2019   Chronic rhinitis 10/28/2018   Hypermobility of joint 11/01/2016   DDD (degenerative disc disease), thoracic 11/01/2016   Osteopenia of multiple sites 11/01/2016   Positive PPD treated with INH  11/01/2016   Rheumatoid factor positive -CCP  11/01/2016   Myofascial muscle pain 03/28/2016   Insomnia 03/28/2016   Other fatigue 03/28/2016   DDD (degenerative disc disease), lumbar 03/28/2016   DDD (degenerative disc disease), cervical 03/28/2016   Primary osteoarthritis of both hands 03/28/2016   Bronchiectasis (HCC) 07/09/2015   Wheezing 06/16/2015   Chest crackles 06/16/2015    PCP: Vernon Velna SAUNDERS, MD   REFERRING PROVIDER: Lorilee Sven SQUIBB, MD  REFERRING DIAG: M79.18 (ICD-10-CM) - Cervical myofascial pain syndrome  THERAPY DIAG:  Cervicalgia  Myofascial neck pain  Abnormal posture  Rationale for Evaluation and Treatment: Rehabilitation  ONSET DATE: chronic  SUBJECTIVE:                                                                                                                                                                                                          SUBJECTIVE STATEMENT: Reports less discomfort.  Has been compliant with HEP and has an appointment for [facility] in December  Hand dominance: Right  PERTINENT HISTORY:  Cervical myofascial pain not relieved by medication management and other conservative care.   Informed consent was obtained after describing risk and benefits of the procedure with the patient, this includes bleeding, bruising,  infection and medication side effects.  The patient wishes to proceed and has given written consent.  The patient was placed in a seated position.  The area of pain was marked and prepped with Betadine.  It was entered with a 25-gauge 1/2 inch needle and a total of 5 mL of 1% lidocaine  and normal saline was injected into a total of 4 trigger points, after negative draw back for blood.  The patient tolerated the procedure well.  Post procedure instructions were given.   PAIN:  Are you having pain? Yes: NPRS scale: 9/10 Pain location: neck and R shoulder region Pain description: ache, sore, tight Aggravating factors: driving, prolonged  Relieving factors: heat, OTC creams, stretching  PRECAUTIONS: None  RED FLAGS: None     WEIGHT BEARING RESTRICTIONS: No  FALLS:  Has patient fallen in last 6 months? No  OCCUPATION: retired  PLOF: Independent  PATIENT GOALS: To manage my pain  NEXT MD VISIT: TBD  OBJECTIVE:  Note: Objective measures were completed at Evaluation unless otherwise noted.  DIAGNOSTIC FINDINGS:  none  PATIENT SURVEYS:  NDI: 14/50  Minimum Detectable Change (90% confidence): 5 points or 10% points   POSTURE: rounded shoulders and forward head  PALPATION: Myofascial restriction detected.   CERVICAL ROM:   Active ROM A/PROM (deg) eval  Flexion 75%  Extension 75%  Right lateral flexion 50%  Left lateral flexion 50%  Right rotation 90%  Left rotation 75%   (Blank rows = not tested)  UPPER  EXTREMITY ROM: WFL  Active ROM Right eval Left eval  Shoulder flexion    Shoulder extension    Shoulder abduction    Shoulder adduction    Shoulder extension    Shoulder internal rotation    Shoulder external rotation    Elbow flexion    Elbow extension    Wrist flexion    Wrist extension    Wrist ulnar deviation    Wrist radial deviation    Wrist pronation    Wrist supination     (Blank rows = not tested)  UPPER EXTREMITY MMT: [facility]  MMT Right eval Left eval  Shoulder flexion    Shoulder extension    Shoulder abduction    Shoulder adduction    Shoulder extension    Shoulder internal rotation    Shoulder external rotation    Middle trapezius    Lower trapezius    Elbow flexion    Elbow extension    Wrist flexion    Wrist extension    Wrist ulnar deviation    Wrist radial deviation    Wrist pronation    Wrist supination    Grip strength     (Blank rows = not tested)  CERVICAL SPECIAL TESTS:  Neck flexor muscle endurance test: Negative  FUNCTIONAL TESTS:  deferred  TREATMENT:         [facility] Adult PT Treatment:                                                DATE: 03/11/24 Therapeutic Exercise: Nustep L4 8 min Manual Therapy: Scalene stretch 30s x3 B Levator stretch 30s x2 B UT stretch 30s x2 B Therapeutic Activity: Seated hor abd RTB 15x Seated ER RTB 15x Supine hor abd RTB 15x B, 15/15  Supine alt OH flexion 1# 15/15  [facility] Adult PT Treatment:  DATE: 02/12/24 Therapeutic Exercise: UBE L1 3/3 min SCM stretch 30s x2 B Manual Therapy: Scalene stretch 30s x3 B Levator stretch 30s x2 B UT stretch 30s x2 B Neuromuscular re-ed: Seated hor abd YTB 15x Seated ER YTB 15x Supine hor abd YTB 15x B, 15/15                                                                                                                  [facility] Adult PT Treatment:                                                DATE: 01/30/24 Eval and  HEP Self Care:  Additional minutes spent for educating on updated Therapeutic Home Exercise Program as well as comparing current status to condition at start of symptoms. This included exercises focusing on stretching, strengthening, with focus on eccentric aspects. Long term goals include an improvement in range of motion, strength, endurance as well as avoiding reinjury. Patient's frequency would include in 1-2 times a day, 3-5 times a week for a duration of 6-12 weeks. Proper technique shown and discussed handout in great detail. All questions were discussed and addressed.       PATIENT EDUCATION:  Education details: Discussed eval findings, rehab rationale and POC and patient is in agreement  Person educated: Patient Education method: Explanation and Handouts Education comprehension: verbalized understanding and needs further education  HOME EXERCISE PROGRAM: Access Code: [email or web address] URL: [email or web address] Date: 02/12/2024 Prepared by: Reyes Kohut  Exercises - Sternocleidomastoid Stretch  - 2-3 x daily - 5 x weekly - 1 sets - 2 reps - 30s hold - Shoulder External Rotation and Scapular Retraction with Resistance  - 1 x daily - 5 x weekly - 1-2 sets - 10 reps - Supine Shoulder Horizontal Abduction with Resistance  - 1 x daily - 5 x weekly - 1-2 sets - 15 reps  ASSESSMENT:  CLINICAL IMPRESSION:  Continued postural retraining and manual stretching to cervico thoracic region.  Advanced to more difficult band, increasing resistance.  Able to tolerate all tasks w/o exacerbation of symptoms.  Patient to undergo MFR and will f/u with 1-2 additional PT sessions  Patient is a 72 y.o. female who was seen today for physical therapy evaluation and treatment for chronic myofascial cervical pain w/o radicular symptoms.  BUE ROM and strength WNL.  Patient presents with forward head and rounded shoulder posturing with myofascial restriction detected.   Patient would benefit fro OPPT to  establish home based stretching and postural correction program in conjunction with periodic massage therapy/MFR sessions.  OBJECTIVE IMPAIRMENTS: decreased activity tolerance, decreased knowledge of condition, decreased ROM, increased fascial restrictions, impaired perceived functional ability, impaired UE functional use, postural dysfunction, and pain.   ACTIVITY LIMITATIONS: carrying, lifting, sitting, sleeping, bed mobility, and driving  PERSONAL FACTORS: Age, Fitness, and Past/current experiences are also affecting patient's functional outcome.  REHAB POTENTIAL: Fair due to chronicity and nature of symptoms  CLINICAL DECISION MAKING: Stable/uncomplicated  EVALUATION COMPLEXITY: Low   GOALS: Goals reviewed with patient? No   SHORT TERM GOALS=LONG TERM GOALS: Target date: 03/31/24  Patient will score at least 9/50 on NDI to signify clinically meaningful improvement in functional abilities.   Baseline: 14/50 Goal status: INITIAL  2.  Patient will acknowledge 6/10 pain at least once during episode of care   Baseline: 9/10 Goal status: INITIAL  3.  Patient to demonstrate independence in HEP Baseline: [email or web address] Goal status: INITIAL  4.  Increase cervical mobility to 75% Baseline:  Active ROM A/PROM (deg) eval  Flexion 75%  Extension 75%  Right lateral flexion 50%  Left lateral flexion 50%  Right rotation 90%  Left rotation 75%   Goal status: INITIAL   PLAN:  PT FREQUENCY: 1x/week  PT DURATION: 6 weeks  PLANNED INTERVENTIONS: 97110-Therapeutic exercises, 97530- Therapeutic activity, 97112- Neuromuscular re-education, 97535- Self Care, [id number]- Manual therapy, and Patient/Family education  PLAN FOR NEXT SESSION: HEP review and update, manual techniques as appropriate, aerobic tasks, ROM and flexibility activities, strengthening and PREs, TPDN, gait and balance training as needed     Reyes CHRISTELLA Kohut, PT 03/11/2024, 3:26 PM

## 2024-03-11 ENCOUNTER — Ambulatory Visit: Attending: Physical Medicine and Rehabilitation

## 2024-03-11 DIAGNOSIS — R293 Abnormal posture: Secondary | ICD-10-CM | POA: Insufficient documentation

## 2024-03-11 DIAGNOSIS — M542 Cervicalgia: Secondary | ICD-10-CM | POA: Insufficient documentation

## 2024-04-01 ENCOUNTER — Other Ambulatory Visit: Payer: Self-pay | Admitting: Physical Medicine and Rehabilitation

## 2024-04-01 DIAGNOSIS — M545 Low back pain, unspecified: Secondary | ICD-10-CM

## 2024-04-03 ENCOUNTER — Other Ambulatory Visit: Payer: Self-pay | Admitting: Physical Medicine and Rehabilitation

## 2024-04-03 DIAGNOSIS — M545 Low back pain, unspecified: Secondary | ICD-10-CM

## 2024-04-04 NOTE — Telephone Encounter (Signed)
 Victoria Watson is requesting a refill of Tramadol  50 MG. Please send to [facility] on [address]. She does not have any on hand.   Thank you

## 2024-04-07 ENCOUNTER — Encounter: Payer: Self-pay | Admitting: Physical Medicine and Rehabilitation

## 2024-04-07 ENCOUNTER — Encounter: Admitting: Physical Medicine and Rehabilitation

## 2024-04-07 VITALS — BP 125/77 | HR 92 | Ht <= 58 in | Wt 104.0 lb

## 2024-04-07 DIAGNOSIS — M7918 Myalgia, other site: Secondary | ICD-10-CM | POA: Insufficient documentation

## 2024-04-07 MED ORDER — LIDOCAINE HCL 1 % IJ SOLN
5.0000 mL | Freq: Once | INTRAMUSCULAR | Status: AC
  Administered 2024-04-07: 12:00:00 5 mL

## 2024-04-07 NOTE — Progress Notes (Signed)

## 2024-06-23 ENCOUNTER — Encounter: Admitting: Physical Medicine and Rehabilitation
